# Patient Record
Sex: Female | Born: 1955 | Race: Black or African American | Hispanic: No | Smoking: Never smoker
Health system: Southern US, Community
[De-identification: ages and names within clinical notes are randomized; demographics above are authoritative.]

## PROBLEM LIST (undated history)

## (undated) DIAGNOSIS — I10 Essential (primary) hypertension: Secondary | ICD-10-CM

## (undated) DIAGNOSIS — E039 Hypothyroidism, unspecified: Secondary | ICD-10-CM

## (undated) HISTORY — PX: COLONOSCOPY: SHX174

## (undated) HISTORY — PX: ABDOMINAL HYSTERECTOMY: SHX81

## (undated) HISTORY — DX: Hypothyroidism, unspecified: E03.9

## (undated) HISTORY — DX: Essential (primary) hypertension: I10

---

## 2018-08-08 ENCOUNTER — Ambulatory Visit: Payer: Self-pay | Admitting: "Endocrinology

## 2018-11-24 ENCOUNTER — Encounter: Payer: Self-pay | Admitting: "Endocrinology

## 2018-11-24 ENCOUNTER — Other Ambulatory Visit: Payer: Self-pay

## 2018-11-24 ENCOUNTER — Ambulatory Visit (INDEPENDENT_AMBULATORY_CARE_PROVIDER_SITE_OTHER): Payer: BLUE CROSS/BLUE SHIELD | Admitting: "Endocrinology

## 2018-11-24 ENCOUNTER — Encounter (INDEPENDENT_AMBULATORY_CARE_PROVIDER_SITE_OTHER): Payer: Self-pay

## 2018-11-24 VITALS — BP 146/90 | HR 80 | Ht 62.0 in | Wt 197.0 lb

## 2018-11-24 DIAGNOSIS — E042 Nontoxic multinodular goiter: Secondary | ICD-10-CM | POA: Diagnosis not present

## 2018-11-24 NOTE — Progress Notes (Signed)
Endocrinology Consult Note                                            11/24/2018, 1:19 PM   Subjective:    Patient ID: Katherine Raymond, female    DOB: 01/06/1956, PCP Kathlee NationsEason, Paul, MD   Past Medical History:  Diagnosis Date  . Hypertension   . Hypothyroidism    Past Surgical History:  Procedure Laterality Date  . ABDOMINAL HYSTERECTOMY     Social History   Socioeconomic History  . Marital status: Divorced    Spouse name: Not on file  . Number of children: Not on file  . Years of education: Not on file  . Highest education level: Not on file  Occupational History  . Not on file  Social Needs  . Financial resource strain: Not on file  . Food insecurity    Worry: Not on file    Inability: Not on file  . Transportation needs    Medical: Not on file    Non-medical: Not on file  Tobacco Use  . Smoking status: Never Smoker  . Smokeless tobacco: Never Used  Substance and Sexual Activity  . Alcohol use: Not Currently  . Drug use: Never  . Sexual activity: Not on file  Lifestyle  . Physical activity    Days per week: Not on file    Minutes per session: Not on file  . Stress: Not on file  Relationships  . Social Musicianconnections    Talks on phone: Not on file    Gets together: Not on file    Attends religious service: Not on file    Active member of club or organization: Not on file    Attends meetings of clubs or organizations: Not on file    Relationship status: Not on file  Other Topics Concern  . Not on file  Social History Narrative  . Not on file   Family History  Problem Relation Age of Onset  . Diabetes Mother   . Thyroid disease Mother    Outpatient Encounter Medications as of 11/24/2018  Medication Sig  . ALPRAZolam (XANAX) 0.5 MG tablet every 8 (eight) hours as needed.  Katherine Raymond. amLODipine (NORVASC) 5 MG tablet daily.  Katherine Raymond. labetalol (NORMODYNE) 200 MG tablet 2 (two) times a day.  . levothyroxine (SYNTHROID) 25 MCG tablet Take 25 mcg by mouth daily  before breakfast.  . spironolactone (ALDACTONE) 25 MG tablet daily.   No facility-administered encounter medications on file as of 11/24/2018.    ALLERGIES: No Known Allergies  VACCINATION STATUS:  There is no immunization history on file for this patient.  HPI Katherine Raymond is 63 y.o. female who presents today with a medical history as above. she is being seen in consultation for multinodular goiter requested by Kathlee NationsEason, Paul, MD.  She is known to have multinodular goiter since at least 2016 at which time she underwent biopsy of thyroid nodules with benign findings.  More recently, she observed that her thyroid is growing in size and associated compressive symptoms including dysphagia, shortness of breath, and voice change.    -She is on a low-dose levothyroxine currently at 25 mcg p.o. daily a.m.  Her thyroid function tests from May 2020 are within normal limits.   She denies palpitations, tremors, nor heat/cold intolerance.    Denies family history of thyroid  malignancy, however a family history of various thyroid dysfunctions.   -She denies exposure to neck radiation.  Review of Systems  Constitutional: no recent weight gain/loss, no fatigue, no subjective hyperthermia, no subjective hypothermia Eyes: no blurry vision, no xerophthalmia ENT: no sore throat, no nodules palpated in throat, + dysphagia, +odynophagia, + hoarseness Cardiovascular: no Chest Pain, no Shortness of Breath, no palpitations, no leg swelling Respiratory: no cough, no shortness of breath Gastrointestinal: no Nausea/Vomiting/Diarhhea Musculoskeletal: no muscle/joint aches Skin: no rashes Neurological: no tremors, no numbness, no tingling, no dizziness Psychiatric: no depression, no anxiety  Objective:    BP (!) 146/90   Pulse 80   Ht 5\' 2"  (1.575 m)   Wt 197 lb (89.4 kg)   BMI 36.03 kg/m   Wt Readings from Last 3 Encounters:  11/24/18 197 lb (89.4 kg)    Physical Exam  Constitutional: + Obese for  height, not in acute distress, normal state of mind Eyes: PERRLA, EOMI, no exophthalmos ENT: moist mucous membranes, + gross thyromegaly, no gross cervical lymphadenopathy Cardiovascular: normal precordial activity, Regular Rate and Rhythm, no Murmur/Rubs/Gallops Respiratory:  adequate breathing efforts, no gross chest deformity, Clear to auscultation bilaterally Gastrointestinal: abdomen soft, Non -tender, No distension, Bowel Sounds present, no gross organomegaly Musculoskeletal: no gross deformities, strength intact in all four extremities Skin: moist, warm, no rashes Neurological: no tremor with outstretched hands, Deep tendon reflexes normal in bilateral lower extremities.  She does not have recent thyroid ultrasound.  Ultrasound-guided fine-needle aspiration from January 2016 was reviewed, showing benign findings.     Oct 28, 2018 thyroid function tests: TSH 1.5, free T4 1.2  Assessment & Plan:   1. Multinodular goiter - [name]  is being seen at a kind request of Katherine Hong, MD. - I have reviewed her available thyroid records and clinically evaluated the patient. - Based on these reviews, she has compressive nodular goiter,  however,  there is not sufficient information to proceed with definitive treatment plan. -This patient will benefit from direct simple thyroidectomy given her presentation with symptomatic compressive large goiter.  She is reluctant.  However she is willing to go for repeat thyroid ultrasound. She will be reevaluated with thyroid ultrasound in 2 weeks, when she will most likely need referral to surgery.  -She recalls that initiation of levothyroxine was for suppressive purposes which is proving to be effective.  Her thyroid function test from May 2020 were within normal limits. -She is advised to continue with levothyroxine for now at current dose of 25 mcg p.o. daily before breakfast.  - We discussed about the correct intake of her thyroid hormone, on  empty stomach at fasting, with water, separated by at least 30 minutes from breakfast and other medications,  and separated by more than 4 hours from calcium, iron, multivitamins, acid reflux medications (PPIs). -Patient is made aware of the fact that thyroid hormone replacement is needed for life, dose to be adjusted by periodic monitoring of thyroid function tests.  If she is in agreement with total thyroidectomy, she will require higher dose of levothyroxine subsequent to her surgery.   - I did not initiate any new prescriptions today. - I advised her  to maintain close follow up with Katherine Hong, MD for primary care needs.   - Time spent with the patient: 45 minutes, of which >50% was spent in obtaining information about her symptoms, reviewing her previous labs/studies,  evaluations, and treatments, counseling her about her large compressive multinodular goiter, and developing  a plan to confirm the diagnosis and long term treatment based on the latest standards of care/guidelines.    Chakira Daphine Raymond participated in the discussions, expressed understanding, and voiced agreement with the above plans.  All questions were answered to her satisfaction. she is encouraged to contact clinic should she have any questions or concerns prior to her return visit.  Follow up plan: Return in about 2 weeks (around 12/08/2018) for Thyroid / Neck Ultrasound.   Marquis LunchGebre Avneet Ashmore, MD [facility] [address] [place], [place] [address] Phone: [phone]  Fax: [phone]     11/24/2018, 1:19 PM  This note was partially dictated with voice recognition software. Similar sounding words can be transcribed inadequately or may not  be corrected upon review.

## 2018-12-12 ENCOUNTER — Ambulatory Visit (HOSPITAL_COMMUNITY)
Admission: RE | Admit: 2018-12-12 | Discharge: 2018-12-12 | Disposition: A | Discharge status: Home / Self Care | Payer: BLUE CROSS/BLUE SHIELD | Source: Ambulatory Visit | Attending: "Endocrinology | Admitting: "Endocrinology

## 2018-12-12 ENCOUNTER — Other Ambulatory Visit: Payer: Self-pay

## 2018-12-12 DIAGNOSIS — E042 Nontoxic multinodular goiter: Secondary | ICD-10-CM | POA: Diagnosis not present

## 2018-12-19 ENCOUNTER — Ambulatory Visit (INDEPENDENT_AMBULATORY_CARE_PROVIDER_SITE_OTHER): Payer: BLUE CROSS/BLUE SHIELD | Admitting: "Endocrinology

## 2018-12-19 ENCOUNTER — Other Ambulatory Visit: Payer: Self-pay

## 2018-12-19 ENCOUNTER — Encounter: Payer: Self-pay | Admitting: "Endocrinology

## 2018-12-19 DIAGNOSIS — E042 Nontoxic multinodular goiter: Secondary | ICD-10-CM | POA: Diagnosis not present

## 2018-12-19 NOTE — Progress Notes (Signed)
12/19/2018, 1:11 PM                                Endocrinology Telehealth Visit Follow up Note -During COVID -19 Pandemic  I connected with Katherine Raymond on 12/19/2018   by telephone and verified that I am speaking with the correct person using two identifiers. Katherine Raymond, 02-Jul-1955. she has verbally consented to this visit. All issues noted in this document were discussed and addressed. The format was not optimal for physical exam   Subjective:    Patient ID: Katherine Raymond, female    DOB: Dec 25, 1955, PCP Eber Hong, MD   Past Medical History:  Diagnosis Date  . Hypertension   . Hypothyroidism    Past Surgical History:  Procedure Laterality Date  . ABDOMINAL HYSTERECTOMY     Social History   Socioeconomic History  . Marital status: Divorced    Spouse name: Not on file  . Number of children: Not on file  . Years of education: Not on file  . Highest education level: Not on file  Occupational History  . Not on file  Social Needs  . Financial resource strain: Not on file  . Food insecurity    Worry: Not on file    Inability: Not on file  . Transportation needs    Medical: Not on file    Non-medical: Not on file  Tobacco Use  . Smoking status: Never Smoker  . Smokeless tobacco: Never Used  Substance and Sexual Activity  . Alcohol use: Not Currently  . Drug use: Never  . Sexual activity: Not on file  Lifestyle  . Physical activity    Days per week: Not on file    Minutes per session: Not on file  . Stress: Not on file  Relationships  . Social Herbalist on phone: Not on file    Gets together: Not on file    Attends religious service: Not on file    Active member of club or organization: Not on file    Attends meetings of clubs or organizations: Not on file    Relationship status: Not on file  Other Topics Concern  . Not on file  Social History Narrative  . Not on file   Family History   Problem Relation Age of Onset  . Diabetes Mother   . Thyroid disease Mother    Outpatient Encounter Medications as of 12/19/2018  Medication Sig  . ALPRAZolam (XANAX) 0.5 MG tablet every 8 (eight) hours as needed.  Marland Kitchen amLODipine (NORVASC) 5 MG tablet daily.  Marland Kitchen labetalol (NORMODYNE) 200 MG tablet 2 (two) times a day.  . levothyroxine (SYNTHROID) 25 MCG tablet Take 25 mcg by mouth daily before breakfast.  . spironolactone (ALDACTONE) 25 MG tablet daily.   No facility-administered encounter medications on file as of 12/19/2018.    ALLERGIES: No Known Allergies  VACCINATION STATUS:  There is no immunization history on file for this patient.  HPI Katherine Raymond is 63 y.o. female who presents today with a medical history as above. she is being seen in follow-up with repeat thyroid ultrasound after she was seen in consultation  for multinodular goiter last visit  Requested by Kathlee NationsEason, Paul, MD.  She is known to have multinodular goiter since at least 2016 at which time she underwent biopsy of thyroid nodules with benign findings.  More recently, she observed that her thyroid is growing in size and associated compressive symptoms including dysphagia, shortness of breath, and voice change.   -Her repeat ultrasound shows varus thyroid nodules as he has 3 and half centimeter requiring fine-needle aspiration.  She continues to have compressive symptoms including voice change.  -She is on a low-dose levothyroxine currently at 25 mcg p.o. daily a.m.  Her thyroid function tests from May 2020 are within normal limits.   She denies palpitations, tremors, nor heat/cold intolerance.    Denies family history of thyroid malignancy, however a family history of various thyroid dysfunctions.   -She denies exposure to neck radiation.  Review of Systems   ENT: no sore throat, no nodules palpated in throat, + dysphagia, +odynophagia, + hoarseness   Objective:    There were no vitals taken for this visit.   Wt Readings from Last 3 Encounters:  11/24/18 197 lb (89.4 kg)      She does not have recent thyroid ultrasound.  Ultrasound-guided fine-needle aspiration from January 2016 was reviewed, showing benign findings.     Oct 28, 2018 thyroid function tests: TSH 1.5, free T4 1.2    July 13th 2020 thyroid ultrasound IMPRESSION: 1. Findings suggestive of multinodular goiter. 2. Multiple bilateral nodules/pseudo nodules meet imaging criteria to recommend percutaneous sampling, however current imaging guidelines are to only biopsy the two most worrisome thyroid nodules at a given time. As such, nodules #2 and #4 could undergo ultrasound-guided fine-needle aspiration as clinically indicated.  Assessment & Plan:   1. Multinodular goiter 2.  Compressive symptoms. -Her repeat thyroid ultrasound confirms large multinodular goiter with some of her nodules displaying suspicious features.  She will need multiple fine-needle aspiration studies. -However, this patient will benefit from direct simple thyroidectomy given her presentation with symptomatic compressive large goiter.  -She is aware of the fact that she will need thyroid hormone replacement on a higher dose for rest of her life subsequent to her surgery.   -This will afford her relief of compressive neck symptoms, as well as elimination of pain risk of occult thyroid malignancy.    She understands and she agrees with plan.  I will arrange for referral [facility] [place] [place] [facility] [place].    -In the meantime, she is advised to continue her low-dose levothyroxine she will be reevaluated with thyroid ultrasound in 2 weeks, when she will most likely need referral to surgery.  -She recalls that initiation of levothyroxine was for suppressive purposes which is proving to be effective.  Her thyroid function test from May 2020 were within normal limits. -She is advised to continue with levothyroxine for now at current dose of 25 mcg  p.o. daily before breakfast.  This was started even before her last visit.  She will definitely require higher dose after her surgery.   - I did not initiate any new prescriptions today. - I advised her  to maintain close follow up with Kathlee NationsEason, Paul, MD for primary care needs.   Time for this visit: 15 minutes. Sera Daphine DeutscherMartin  participated in the discussions, expressed understanding, and voiced agreement with the above plans.  All questions were answered to her satisfaction. she is encouraged to contact clinic should she have any questions or concerns prior to her return  visit.   Follow up plan: Return in about 3 months (around 03/21/2019) for Follow up with Labs after Surgery.   Marquis LunchGebre Zenora Karpel, MD [facility] [address] [place], [place] [address] Phone: [phone]  Fax: [phone]     12/19/2018, 1:11 PM  This note was partially dictated with voice recognition software. Similar sounding words can be transcribed inadequately or may not  be corrected upon review.

## 2019-01-03 ENCOUNTER — Ambulatory Visit: Payer: BLUE CROSS/BLUE SHIELD | Admitting: General Surgery

## 2019-01-24 ENCOUNTER — Encounter: Payer: Self-pay | Admitting: General Surgery

## 2019-01-24 ENCOUNTER — Ambulatory Visit: Payer: BLUE CROSS/BLUE SHIELD | Admitting: General Surgery

## 2019-01-24 ENCOUNTER — Other Ambulatory Visit: Payer: Self-pay

## 2019-01-24 VITALS — BP 164/96 | HR 93 | Temp 97.5°F | Resp 16 | Ht 64.0 in | Wt 199.0 lb

## 2019-01-24 DIAGNOSIS — E042 Nontoxic multinodular goiter: Secondary | ICD-10-CM

## 2019-01-24 NOTE — Progress Notes (Signed)
Katherine Raymond; [id number]; 03/26/1956   HPI Patient is a 63 year old black female who was referred to my care by Dr. Fransico HimNida for evaluation treatment of a multinodular goiter.  Patient has had the goiter for some time now, but seems to have recently increased in size and is causing her multiple symptoms.  She has had more hoarseness to her voice and dysphasia.  She denies a family history of thyroid cancer, though multiple family members have had thyroid issues including surgery.  She denies any radiation to the neck.  She currently has no pain in her neck.  She does have a sensation that something is sitting on her throat when she is lying down. Past Medical History:  Diagnosis Date  . Hypertension   . Hypothyroidism     Past Surgical History:  Procedure Laterality Date  . ABDOMINAL HYSTERECTOMY      Family History  Problem Relation Age of Onset  . Diabetes Mother   . Thyroid disease Mother     Current Outpatient Medications on File Prior to Visit  Medication Sig Dispense Refill  . ALPRAZolam (XANAX) 0.5 MG tablet every 8 (eight) hours as needed.    Marland Kitchen. amLODipine (NORVASC) 5 MG tablet daily.    . cetirizine (ZYRTEC) 10 MG chewable tablet Chew 10 mg by mouth daily.    Marland Kitchen. labetalol (NORMODYNE) 200 MG tablet 2 (two) times a day.    . levothyroxine (SYNTHROID) 25 MCG tablet Take 25 mcg by mouth daily before breakfast.    . Multiple Vitamin (MULTI-DAY PO) Take 1 tablet by mouth 1 day or 1 dose.    . spironolactone (ALDACTONE) 25 MG tablet daily.     No current facility-administered medications on file prior to visit.     Allergies  Allergen Reactions  . Pantoprazole     Rash under breast. Taste bud problems.     Social History   Substance and Sexual Activity  Alcohol Use Not Currently    Social History   Tobacco Use  Smoking Status Never Smoker  Smokeless Tobacco Never Used    Review of Systems  Constitutional: Negative.   HENT: Positive for sinus pain.   Respiratory:  Positive for cough.   Gastrointestinal: Positive for heartburn.  Genitourinary: Negative.   Musculoskeletal: Positive for joint pain.  Skin: Negative.   Neurological: Negative.   Endo/Heme/Allergies: Negative.   Psychiatric/Behavioral: Negative.     Objective   Vitals:   01/24/19 1051  BP: (!) 164/96  Pulse: 93  Resp: 16  Temp: (!) 97.5 F (36.4 C)  SpO2: 98%    Physical Exam Vitals signs reviewed.  Constitutional:      Appearance: Normal appearance. She is not ill-appearing.  HENT:     Head: Normocephalic and atraumatic.  Neck:     Musculoskeletal: Neck supple.     Comments: Bilateral enlargement of the thyroid lobes, trachea midline with no significant extension into the chest.  Nodularity of both lobes noted. Cardiovascular:     Rate and Rhythm: Normal rate and regular rhythm.     Heart sounds: Normal heart sounds. No murmur. No friction rub. No gallop.   Pulmonary:     Effort: Pulmonary effort is normal. No respiratory distress.     Breath sounds: Normal breath sounds. No stridor. No wheezing, rhonchi or rales.  Lymphadenopathy:     Cervical: No cervical adenopathy.  Skin:    General: Skin is warm and dry.  Neurological:     Mental Status: She is alert and  oriented to person, place, and time.    Ultrasound report reviewed, Dr. Liliane Channel note reviewed. Assessment  Multinodular goiter Plan   Patient is scheduled for a total thyroidectomy on 02/10/2019.  The risks and benefits of the procedure including bleeding, infection, nerve injury, voice changes, low calcium, the possibility of malignancy were fully explained to the patient, who gave informed consent.  She is fully aware that she will need to be on lifelong thyroid supplementation.  Literature was given.

## 2019-01-24 NOTE — H&P (Signed)
Katherine Raymond; [id number]; 08/25/1955   HPI Patient is a 62-year-old black female who was referred to my care by Dr. Nida for evaluation treatment of a multinodular goiter.  Patient has had the goiter for some time now, but seems to have recently increased in size and is causing her multiple symptoms.  She has had more hoarseness to her voice and dysphasia.  She denies a family history of thyroid cancer, though multiple family members have had thyroid issues including surgery.  She denies any radiation to the neck.  She currently has no pain in her neck.  She does have a sensation that something is sitting on her throat when she is lying down. Past Medical History:  Diagnosis Date  . Hypertension   . Hypothyroidism     Past Surgical History:  Procedure Laterality Date  . ABDOMINAL HYSTERECTOMY      Family History  Problem Relation Age of Onset  . Diabetes Mother   . Thyroid disease Mother     Current Outpatient Medications on File Prior to Visit  Medication Sig Dispense Refill  . ALPRAZolam (XANAX) 0.5 MG tablet every 8 (eight) hours as needed.    . amLODipine (NORVASC) 5 MG tablet daily.    . cetirizine (ZYRTEC) 10 MG chewable tablet Chew 10 mg by mouth daily.    . labetalol (NORMODYNE) 200 MG tablet 2 (two) times a day.    . levothyroxine (SYNTHROID) 25 MCG tablet Take 25 mcg by mouth daily before breakfast.    . Multiple Vitamin (MULTI-DAY PO) Take 1 tablet by mouth 1 day or 1 dose.    . spironolactone (ALDACTONE) 25 MG tablet daily.     No current facility-administered medications on file prior to visit.     Allergies  Allergen Reactions  . Pantoprazole     Rash under breast. Taste bud problems.     Social History   Substance and Sexual Activity  Alcohol Use Not Currently    Social History   Tobacco Use  Smoking Status Never Smoker  Smokeless Tobacco Never Used    Review of Systems  Constitutional: Negative.   HENT: Positive for sinus pain.   Respiratory:  Positive for cough.   Gastrointestinal: Positive for heartburn.  Genitourinary: Negative.   Musculoskeletal: Positive for joint pain.  Skin: Negative.   Neurological: Negative.   Endo/Heme/Allergies: Negative.   Psychiatric/Behavioral: Negative.     Objective   Vitals:   01/24/19 1051  BP: (!) 164/96  Pulse: 93  Resp: 16  Temp: (!) 97.5 F (36.4 C)  SpO2: 98%    Physical Exam Vitals signs reviewed.  Constitutional:      Appearance: Normal appearance. She is not ill-appearing.  HENT:     Head: Normocephalic and atraumatic.  Neck:     Musculoskeletal: Neck supple.     Comments: Bilateral enlargement of the thyroid lobes, trachea midline with no significant extension into the chest.  Nodularity of both lobes noted. Cardiovascular:     Rate and Rhythm: Normal rate and regular rhythm.     Heart sounds: Normal heart sounds. No murmur. No friction rub. No gallop.   Pulmonary:     Effort: Pulmonary effort is normal. No respiratory distress.     Breath sounds: Normal breath sounds. No stridor. No wheezing, rhonchi or rales.  Lymphadenopathy:     Cervical: No cervical adenopathy.  Skin:    General: Skin is warm and dry.  Neurological:     Mental Status: She is alert and   oriented to person, place, and time.    Ultrasound report reviewed, Dr. Nida's note reviewed. Assessment  Multinodular goiter Plan   Patient is scheduled for a total thyroidectomy on 02/10/2019.  The risks and benefits of the procedure including bleeding, infection, nerve injury, voice changes, low calcium, the possibility of malignancy were fully explained to the patient, who gave informed consent.  She is fully aware that she will need to be on lifelong thyroid supplementation.  Literature was given. 

## 2019-01-24 NOTE — Patient Instructions (Signed)
Thyroidectomy A thyroidectomy is a surgery that is done to remove the thyroid gland. The thyroid is a butterfly-shaped gland that is located at the lower front of your neck. It produces thyroid hormone, which is a substance that helps to control certain body processes. You may have a:  Total thyroidectomy. All of your thyroid is removed.  Thyroid lobectomy. Part of your thyroid is removed. The amount of thyroid gland tissue that is removed during your surgery depends on the reason for the procedure. Reasons to have this procedure include treatment for:  Thyroid nodules.  Thyroid cancer.  Benign thyroid tumors.  Goiter.  Overactive thyroid gland (hyperthyroidism). There are two ways to do this procedure. Conventional, or open, thyroidectomy uses one large incision to remove the thyroid gland. This is the most common method. Endoscopic thyroidectomy, a less invasive method, uses a narrow tube with a light and camera (endoscope) to remove the gland. Tell a health care provider about:  Any allergies you have.  All medicines you are taking, including vitamins, herbs, eye drops, creams, and over-the-counter medicines.  Any problems you or family members have had with anesthetic medicines.  Any blood disorders you have.  Any surgeries you have had.  Any medical conditions you have.  Whether you are pregnant or may be pregnant. What are the risks? Generally, this is a safe procedure. However, problems may occur, including:  Damage to the parathyroid glands. These are located behind your thyroid gland. They maintain the calcium levels in the body. Damage may lead to: ? A decrease in parathyroid hormone levels (hypoparathyroidism). ? A decrease in calcium levels. This will make your nerves irritable and may cause muscle spasms.  An increase in thyroid hormone.  Damage to the nerves of your voice box (larynx). This can be temporary or long-term (rare).  Hoarseness. This usually  resolves in 24-48 hours.  Bleeding.  Infection. What happens before the procedure? Medicines Ask your health care provider about:  Changing or stopping your regular medicines. This is especially important if you are taking diabetes medicines or blood thinners.  Taking medicines such as aspirin and ibuprofen. These medicines can thin your blood. Do not take these medicines unless your health care provider tells you to take them.  Taking over-the-counter medicines, vitamins, herbs, and supplements. General instructions  You may be asked to shower with a germ-killing soap.  Plan to have someone take you home from the hospital or clinic.  Plan to have a responsible adult care for you for at least 24 hours after you leave the hospital or clinic. This is important. What happens during the procedure?  To reduce your risk of infection: ? Your health care team will wash or sanitize their hands. ? Hair may be removed from the surgical area. ? Your skin will be washed with soap.  An IV will be inserted into one of your veins.  You will be given one or more of the following: ? A medicine to help you relax (sedative). ? A medicine to make you fall asleep (general anesthetic).  Your health care provider will perform your surgery using one of two methods: ? For open thyroidectomy, an incision will be made in your lower neck. Muscles in the area will be separated to reveal your thyroid gland. ? For endoscopic thyroidectomy, several small incisions will be made in your neck, chest, or armpit. An endoscopewill be inserted into an incision.  Your health care provider may monitor laryngeal nerve function during the procedure for  safety reasons.  Part or all of your thyroid gland will be removed.  A tube (drain) may be placed at the incision site to drain blood and fluids that accumulate under the skin after the procedure. The drain may have to stay in place for a day or two after the procedure.   The incision will be closed with stitches (sutures).  A dressing will be placed over your incision. The procedure may vary among health care providers and hospitals. What happens after the procedure?  Your blood pressure, heart rate, breathing rate, and blood oxygen level will be monitored often until the medicines you were given have worn off.  You will be given pain medicine as needed.  Your provider will check your ability to talk and swallow after the procedure.  You will gradually start to drink liquids and have soft foods as tolerated.  You may have a blood test to check the level of calcium in your body.  If you had a drain put in during the procedure, it will usually be removed the next day. Summary  A thyroidectomy is a surgery that is done to remove the thyroid gland.  The procedure will be done in one of two ways: conventional, or open, thyroidectomy or endoscopic thyroidectomy.  Serious complications are rare.  Plan to have a responsible adult care for you for at least 24 hours after you leave the hospital or clinic. This is important. This information is not intended to replace advice given to you by your health care provider. Make sure you discuss any questions you have with your health care provider. Document Released: 11/11/2000 Document Revised: 04/30/2017 Document Reviewed: 03/23/2017 Elsevier Patient Education  2020 [employer].

## 2019-01-31 NOTE — Patient Instructions (Signed)
Your procedure is scheduled on: 02/10/2019  Report to [facility] at 6:15     AM.  Call this number if you have problems the morning of surgery: [phone]   Remember:   Do not Eat or Drink after midnight   :  Take these medicines the morning of surgery with A SIP OF WATER: Amlodipine, Zyrtec, labetalol, and levothroxine   Do not wear jewelry, make-up or nail polish.  Do not wear lotions, powders, or perfumes. You may wear deodorant.  Do not shave 48 hours prior to surgery. Men may shave face and neck.  Do not bring valuables to the hospital.  Contacts, dentures or bridgework may not be worn into surgery.  Leave suitcase in the car. After surgery it may be brought to your room.  For patients admitted to the hospital, checkout time is 11:00 AM the day of discharge.   Patients discharged the day of surgery will not be allowed to drive home.    Special Instructions: Shower using CHG night before surgery and shower the day of surgery use CHG.  Use special wash - you have one bottle of CHG for all showers.  You should use approximately 1/2 of the bottle for each shower.  Thyroidectomy A thyroidectomy is a surgery that is done to remove the thyroid gland. The thyroid is a butterfly-shaped gland that is located at the lower front of your neck. It produces thyroid hormone, which is a substance that helps to control certain body processes. You may have a:  Total thyroidectomy. All of your thyroid is removed.  Thyroid lobectomy. Part of your thyroid is removed. The amount of thyroid gland tissue that is removed during your surgery depends on the reason for the procedure. Reasons to have this procedure include treatment for:  Thyroid nodules.  Thyroid cancer.  Benign thyroid tumors.  Goiter.  Overactive thyroid gland (hyperthyroidism). There are two ways to do this procedure. Conventional, or open, thyroidectomy uses one large incision to remove the thyroid gland. This is the most  common method. Endoscopic thyroidectomy, a less invasive method, uses a narrow tube with a light and camera (endoscope) to remove the gland. Tell a health care provider about:  Any allergies you have.  All medicines you are taking, including vitamins, herbs, eye drops, creams, and over-the-counter medicines.  Any problems you or family members have had with anesthetic medicines.  Any blood disorders you have.  Any surgeries you have had.  Any medical conditions you have.  Whether you are pregnant or may be pregnant. What are the risks? Generally, this is a safe procedure. However, problems may occur, including:  Damage to the parathyroid glands. These are located behind your thyroid gland. They maintain the calcium levels in the body. Damage may lead to: ? A decrease in parathyroid hormone levels (hypoparathyroidism). ? A decrease in calcium levels. This will make your nerves irritable and may cause muscle spasms.  An increase in thyroid hormone.  Damage to the nerves of your voice box (larynx). This can be temporary or long-term (rare).  Hoarseness. This usually resolves in 24-48 hours.  Bleeding.  Infection. What happens before the procedure? Staying hydrated Follow instructions from your health care provider about hydration, which may include:  Up to 2 hours before the procedure - you may continue to drink clear liquids, such as water, clear fruit juice, black coffee, and plain tea. Eating and drinking restrictions Follow instructions from your health care provider about eating and drinking, which may  include:  8 hours before the procedure - stop eating heavy meals or foods such as meat, fried foods, or fatty foods.  6 hours before the procedure - stop eating light meals or foods, such as toast or cereal.  6 hours before the procedure - stop drinking milk or drinks that contain milk.  2 hours before the procedure - stop drinking clear liquids. Medicines Ask your  health care provider about:  Changing or stopping your regular medicines. This is especially important if you are taking diabetes medicines or blood thinners.  Taking medicines such as aspirin and ibuprofen. These medicines can thin your blood. Do not take these medicines unless your health care provider tells you to take them.  Taking over-the-counter medicines, vitamins, herbs, and supplements. General instructions  You may be asked to shower with a germ-killing soap.  Plan to have someone take you home from the hospital or clinic.  Plan to have a responsible adult care for you for at least 24 hours after you leave the hospital or clinic. This is important. What happens during the procedure?  To reduce your risk of infection: ? Your health care team will wash or sanitize their hands. ? Hair may be removed from the surgical area. ? Your skin will be washed with soap.  An IV will be inserted into one of your veins.  You will be given one or more of the following: ? A medicine to help you relax (sedative). ? A medicine to make you fall asleep (general anesthetic).  Your health care provider will perform your surgery using one of two methods: ? For open thyroidectomy, an incision will be made in your lower neck. Muscles in the area will be separated to reveal your thyroid gland. ? For endoscopic thyroidectomy, several small incisions will be made in your neck, chest, or armpit. An endoscopewill be inserted into an incision.  Your health care provider may monitor laryngeal nerve function during the procedure for safety reasons.  Part or all of your thyroid gland will be removed.  A tube (drain) may be placed at the incision site to drain blood and fluids that accumulate under the skin after the procedure. The drain may have to stay in place for a day or two after the procedure.  The incision will be closed with stitches (sutures).  A dressing will be placed over your incision.  The procedure may vary among health care providers and hospitals. What happens after the procedure?  Your blood pressure, heart rate, breathing rate, and blood oxygen level will be monitored often until the medicines you were given have worn off.  You will be given pain medicine as needed.  Your provider will check your ability to talk and swallow after the procedure.  You will gradually start to drink liquids and have soft foods as tolerated.  You may have a blood test to check the level of calcium in your body.  If you had a drain put in during the procedure, it will usually be removed the next day. Summary  A thyroidectomy is a surgery that is done to remove the thyroid gland.  The procedure will be done in one of two ways: conventional, or open, thyroidectomy or endoscopic thyroidectomy.  Serious complications are rare.  Plan to have a responsible adult care for you for at least 24 hours after you leave the hospital or clinic. This is important. This information is not intended to replace advice given to you by your health care  provider. Make sure you discuss any questions you have with your health care provider. Document Released: 11/11/2000 Document Revised: 04/30/2017 Document Reviewed: 03/23/2017 Elsevier Patient Education  2020 [employer].  Thyroidectomy, Care After This sheet gives you information about how to care for yourself after your procedure. Your health care provider may also give you more specific instructions. If you have problems or questions, contact your health care provider. What can I expect after the procedure? After the procedure, it is common to have:  Mild pain in the neck or upper body, especially when swallowing.  A swollen neck.  A sore throat.  A weak or hoarse voice.  Slight tingling or numbness around your mouth, or in your fingers or toes. This may last for a day or two after surgery. This condition is caused by low levels of calcium.  You may be given calcium supplements to treat it. Follow these instructions at home:  Medicines  Take over-the-counter and prescription medicines only as told by your health care provider.  Do not drive or use heavy machinery while taking prescription pain medicine.  Do not take medicines that contain aspirin and ibuprofen until your health care provider says that you can. These medicines can increase your risk of bleeding.  Take a thyroid hormone medicine as recommended by your health care provider. You will have to take this medicine for the rest of your life if your entire thyroid was removed. Eating and drinking  Start slowly with eating. You may need to have only liquids and soft foods for a few days or as directed by your health care provider.  To prevent or treat constipation while you are taking prescription pain medicine, your health care provider may recommend that you: ? Drink enough fluid to keep your urine pale yellow. ? Take over-the-counter or prescription medicines. ? Eat foods that are high in fiber, such as fresh fruits and vegetables, whole grains, and beans. ? Limit foods that are high in fat and processed sugars, such as fried and sweet foods. Incision care  Follow instructions from your health care provider about how to take care of your incision. Make sure you: ? Wash your hands with soap and water before you change your bandage (dressing). If soap and water are not available, use hand sanitizer. ? Change your dressing as told by your health care provider. ? Leave stitches (sutures), skin glue, or adhesive strips in place. These skin closures may need to stay in place for 2 weeks or longer. If adhesive strip edges start to loosen and curl up, you may trim the loose edges. Do not remove adhesive strips completely unless your health care provider tells you to do that.  Check your incision area every day for signs of infection. Check for: ? Redness, swelling, or pain.  ? Fluid or blood. ? Warmth. ? Pus or a bad smell.  Do not take baths, swim, or use a hot tub until your health care provider approves. Activity  For the first 10 days after the procedure or as instructed by your health care provider: ? Do not lift anything that is heavier than 10 lb (4.5 kg). ? Do not jog, swim, or do other strenuous exercises. ? Do not play contact sports.  Avoid sitting for a long time without moving. Get up to take short walks every 1-2 hours. This is needed to improve blood flow and breathing. Ask for help if you feel weak or unsteady.  Return to your normal activities as  told by your health care provider. Ask your health care provider what activities are safe for you. General instructions  Do not use any products that contain nicotine or tobacco, such as cigarettes and e-cigarettes. These can delay healing after surgery. If you need help quitting, ask your health care provider.  Keep all follow-up visits as told by your health care provider. This is important. Your health care provider needs to monitor the calcium level in your blood to make sure that it does not become low. Contact a health care provider if you:  Have a fever.  Have more redness, swelling, or pain around your incision area.  Have fluid or blood coming from your incision area.  Notice that your incision area feels warm to the touch.  Have pus or a bad smell coming from your incision area.  Have trouble talking.  Have nausea or vomiting for more than 2 days. Get help right away if you:  Have trouble breathing.  Have trouble swallowing.  Develop a rash.  Develop a cough that gets worse.  Notice that your speech changes, or you have hoarseness that gets worse.  Develop numbness, tingling, or muscle spasms in the arms, hands, feet, or face. Summary  After the procedure, it is common to feel mild pain in the neck or upper body, especially when swallowing.  Take medicines as told  by your health care provider. These include pain medicines and thyroid hormones, if required.  Follow instructions from your health care provider about how to take care of your incision. Watch for signs of infection.  Keep all follow-up visits as told by your health care provider. This is important. Your health care provider needs to monitor the calcium level in your blood to make sure that it does not become low.  Get help right away if you develop difficulty breathing, or numbness, tingling, or muscle spasms in the arms, hands, feet, or face. This information is not intended to replace advice given to you by your health care provider. Make sure you discuss any questions you have with your health care provider. Document Released: 12/05/2004 Document Revised: 07/14/2018 Document Reviewed: 03/23/2017 Elsevier Patient Education  2020 [employer]  General Anesthesia, Adult, Care After This sheet gives you information about how to care for yourself after your procedure. Your health care provider may also give you more specific instructions. If you have problems or questions, contact your health care provider. What can I expect after the procedure? After the procedure, the following side effects are common:  Pain or discomfort at the IV site.  Nausea.  Vomiting.  Sore throat.  Trouble concentrating.  Feeling cold or chills.  Weak or tired.  Sleepiness and fatigue.  Soreness and body aches. These side effects can affect parts of the body that were not involved in surgery. Follow these instructions at home:  For at least 24 hours after the procedure:  Have a responsible adult stay with you. It is important to have someone help care for you until you are awake and alert.  Rest as needed.  Do not: ? Participate in activities in which you could fall or become injured. ? Drive. ? Use heavy machinery. ? Drink alcohol. ? Take sleeping pills or medicines that cause drowsiness. ?  Make important decisions or sign legal documents. ? Take care of children on your own. Eating and drinking  Follow any instructions from your health care provider about eating or drinking restrictions.  When you feel hungry, start  by eating small amounts of foods that are soft and easy to digest (bland), such as toast. Gradually return to your regular diet.  Drink enough fluid to keep your urine pale yellow.  If you vomit, rehydrate by drinking water, juice, or clear broth. General instructions  If you have sleep apnea, surgery and certain medicines can increase your risk for breathing problems. Follow instructions from your health care provider about wearing your sleep device: ? Anytime you are sleeping, including during daytime naps. ? While taking prescription pain medicines, sleeping medicines, or medicines that make you drowsy.  Return to your normal activities as told by your health care provider. Ask your health care provider what activities are safe for you.  Take over-the-counter and prescription medicines only as told by your health care provider.  If you smoke, do not smoke without supervision.  Keep all follow-up visits as told by your health care provider. This is important. Contact a health care provider if:  You have nausea or vomiting that does not get better with medicine.  You cannot eat or drink without vomiting.  You have pain that does not get better with medicine.  You are unable to pass urine.  You develop a skin rash.  You have a fever.  You have redness around your IV site that gets worse. Get help right away if:  You have difficulty breathing.  You have chest pain.  You have blood in your urine or stool, or you vomit blood. Summary  After the procedure, it is common to have a sore throat or nausea. It is also common to feel tired.  Have a responsible adult stay with you for the first 24 hours after general anesthesia. It is important to have  someone help care for you until you are awake and alert.  When you feel hungry, start by eating small amounts of foods that are soft and easy to digest (bland), such as toast. Gradually return to your regular diet.  Drink enough fluid to keep your urine pale yellow.  Return to your normal activities as told by your health care provider. Ask your health care provider what activities are safe for you. This information is not intended to replace advice given to you by your health care provider. Make sure you discuss any questions you have with your health care provider. Document Released: 08/24/2000 Document Revised: 05/21/2017 Document Reviewed: 01/01/2017 Elsevier Patient Education  2020 [employer].

## 2019-02-07 ENCOUNTER — Ambulatory Visit (HOSPITAL_COMMUNITY)
Admission: RE | Admit: 2019-02-07 | Discharge: 2019-02-07 | Disposition: A | Discharge status: Home / Self Care | Payer: BLUE CROSS/BLUE SHIELD | Source: Ambulatory Visit | Attending: General Surgery | Admitting: General Surgery

## 2019-02-07 ENCOUNTER — Encounter (HOSPITAL_COMMUNITY)
Admission: RE | Admit: 2019-02-07 | Discharge: 2019-02-07 | Disposition: A | Discharge status: Home / Self Care | Payer: BLUE CROSS/BLUE SHIELD | Source: Ambulatory Visit | Attending: General Surgery | Admitting: General Surgery

## 2019-02-07 ENCOUNTER — Encounter (HOSPITAL_COMMUNITY): Payer: Self-pay

## 2019-02-07 ENCOUNTER — Other Ambulatory Visit: Payer: Self-pay

## 2019-02-07 ENCOUNTER — Other Ambulatory Visit (HOSPITAL_COMMUNITY)
Admission: RE | Admit: 2019-02-07 | Discharge: 2019-02-07 | Disposition: A | Discharge status: Home / Self Care | Payer: BLUE CROSS/BLUE SHIELD | Source: Ambulatory Visit | Attending: General Surgery | Admitting: General Surgery

## 2019-02-07 DIAGNOSIS — E042 Nontoxic multinodular goiter: Secondary | ICD-10-CM

## 2019-02-07 LAB — CBC WITH DIFFERENTIAL/PLATELET
Abs Immature Granulocytes: 0.04 10*3/uL (ref 0.00–0.07)
Basophils Absolute: 0.1 10*3/uL (ref 0.0–0.1)
Basophils Relative: 1 %
Eosinophils Absolute: 0.4 10*3/uL (ref 0.0–0.5)
Eosinophils Relative: 7 %
HCT: 37.1 % (ref 36.0–46.0)
Hemoglobin: 12.7 g/dL (ref 12.0–15.0)
Immature Granulocytes: 1 %
Lymphocytes Relative: 24 %
Lymphs Abs: 1.3 10*3/uL (ref 0.7–4.0)
MCH: 29.4 pg (ref 26.0–34.0)
MCHC: 34.2 g/dL (ref 30.0–36.0)
MCV: 85.9 fL (ref 80.0–100.0)
Monocytes Absolute: 0.3 10*3/uL (ref 0.1–1.0)
Monocytes Relative: 6 %
Neutro Abs: 3.2 10*3/uL (ref 1.7–7.7)
Neutrophils Relative %: 61 %
Platelets: 304 10*3/uL (ref 150–400)
RBC: 4.32 MIL/uL (ref 3.87–5.11)
RDW: 12 % (ref 11.5–15.5)
WBC: 5.2 10*3/uL (ref 4.0–10.5)
nRBC: 0 % (ref 0.0–0.2)

## 2019-02-07 LAB — COMPREHENSIVE METABOLIC PANEL
ALT: 29 U/L (ref 0–44)
AST: 33 U/L (ref 15–41)
Albumin: 4.3 g/dL (ref 3.5–5.0)
Alkaline Phosphatase: 72 U/L (ref 38–126)
Anion gap: 8 (ref 5–15)
BUN: 7 mg/dL — ABNORMAL LOW (ref 8–23)
CO2: 24 mmol/L (ref 22–32)
Calcium: 9.5 mg/dL (ref 8.9–10.3)
Chloride: 109 mmol/L (ref 98–111)
Creatinine, Ser: 0.78 mg/dL (ref 0.44–1.00)
GFR calc Af Amer: >60 mL/min (ref >60)
GFR calc non Af Amer: >60 mL/min (ref >60)
Glucose, Bld: 121 mg/dL — ABNORMAL HIGH (ref 70–99)
Potassium: 3.5 mmol/L (ref 3.5–5.1)
Sodium: 141 mmol/L (ref 135–145)
Total Bilirubin: 2.1 mg/dL — ABNORMAL HIGH (ref 0.3–1.2)
Total Protein: 7.8 g/dL (ref 6.5–8.1)

## 2019-02-08 ENCOUNTER — Encounter (HOSPITAL_COMMUNITY)
Admission: RE | Admit: 2019-02-08 | Discharge: 2019-02-08 | Disposition: A | Discharge status: Home / Self Care | Payer: BLUE CROSS/BLUE SHIELD | Source: Ambulatory Visit | Attending: General Surgery | Admitting: General Surgery

## 2019-02-08 DIAGNOSIS — Z01812 Encounter for preprocedural laboratory examination: Secondary | ICD-10-CM | POA: Insufficient documentation

## 2019-02-08 LAB — TSH: TSH: 1.104 u[IU]/mL (ref 0.350–4.500)

## 2019-02-08 LAB — SARS CORONAVIRUS 2 (TAT 6-24 HRS): SARS Coronavirus 2: NEGATIVE

## 2019-02-10 ENCOUNTER — Encounter (HOSPITAL_COMMUNITY)
Admission: RE | Disposition: A | Discharge status: Home / Self Care | Payer: Self-pay | Source: Home / Self Care | Attending: General Surgery

## 2019-02-10 ENCOUNTER — Observation Stay (HOSPITAL_COMMUNITY)
Admission: RE | Admit: 2019-02-10 | Discharge: 2019-02-11 | Disposition: A | Discharge status: Home / Self Care | Payer: BLUE CROSS/BLUE SHIELD | Attending: General Surgery | Admitting: General Surgery

## 2019-02-10 ENCOUNTER — Encounter (HOSPITAL_COMMUNITY): Payer: Self-pay

## 2019-02-10 ENCOUNTER — Ambulatory Visit (HOSPITAL_COMMUNITY): Payer: BLUE CROSS/BLUE SHIELD | Admitting: Anesthesiology

## 2019-02-10 ENCOUNTER — Other Ambulatory Visit: Payer: Self-pay

## 2019-02-10 DIAGNOSIS — E042 Nontoxic multinodular goiter: Principal | ICD-10-CM

## 2019-02-10 DIAGNOSIS — I10 Essential (primary) hypertension: Secondary | ICD-10-CM | POA: Insufficient documentation

## 2019-02-10 DIAGNOSIS — Z79899 Other long term (current) drug therapy: Secondary | ICD-10-CM | POA: Insufficient documentation

## 2019-02-10 DIAGNOSIS — Z79890 Hormone replacement therapy (postmenopausal): Secondary | ICD-10-CM | POA: Diagnosis not present

## 2019-02-10 DIAGNOSIS — Z8349 Family history of other endocrine, nutritional and metabolic diseases: Secondary | ICD-10-CM | POA: Diagnosis not present

## 2019-02-10 DIAGNOSIS — E039 Hypothyroidism, unspecified: Secondary | ICD-10-CM | POA: Insufficient documentation

## 2019-02-10 DIAGNOSIS — R740 Nonspecific elevation of levels of transaminase and lactic acid dehydrogenase [LDH]: Secondary | ICD-10-CM | POA: Insufficient documentation

## 2019-02-10 DIAGNOSIS — K760 Fatty (change of) liver, not elsewhere classified: Secondary | ICD-10-CM | POA: Diagnosis not present

## 2019-02-10 DIAGNOSIS — E890 Postprocedural hypothyroidism: Secondary | ICD-10-CM

## 2019-02-10 DIAGNOSIS — Z9089 Acquired absence of other organs: Secondary | ICD-10-CM

## 2019-02-10 DIAGNOSIS — R7401 Elevation of levels of liver transaminase levels: Secondary | ICD-10-CM

## 2019-02-10 HISTORY — PX: THYROIDECTOMY: SHX17

## 2019-02-10 LAB — COMPREHENSIVE METABOLIC PANEL
ALT: 267 U/L — ABNORMAL HIGH (ref 0–44)
AST: 234 U/L — ABNORMAL HIGH (ref 15–41)
Albumin: 3.9 g/dL (ref 3.5–5.0)
Alkaline Phosphatase: 83 U/L (ref 38–126)
Anion gap: 11 (ref 5–15)
BUN: 9 mg/dL (ref 8–23)
CO2: 23 mmol/L (ref 22–32)
Calcium: 8.8 mg/dL — ABNORMAL LOW (ref 8.9–10.3)
Chloride: 104 mmol/L (ref 98–111)
Creatinine, Ser: 0.77 mg/dL (ref 0.44–1.00)
GFR calc Af Amer: >60 mL/min (ref >60)
GFR calc non Af Amer: >60 mL/min (ref >60)
Glucose, Bld: 159 mg/dL — ABNORMAL HIGH (ref 70–99)
Potassium: 3.8 mmol/L (ref 3.5–5.1)
Sodium: 138 mmol/L (ref 135–145)
Total Bilirubin: 1.8 mg/dL — ABNORMAL HIGH (ref 0.3–1.2)
Total Protein: 7.6 g/dL (ref 6.5–8.1)

## 2019-02-10 SURGERY — THYROIDECTOMY
Anesthesia: General

## 2019-02-10 MED ORDER — MIDAZOLAM HCL 2 MG/2ML IJ SOLN: 0.5000 mg | Freq: Once | INTRAMUSCULAR | Status: DC | PRN

## 2019-02-10 MED ORDER — PROPOFOL 10 MG/ML IV BOLUS
INTRAVENOUS | Status: DC | PRN
  Administered 2019-02-10: 200 mg via INTRAVENOUS

## 2019-02-10 MED ORDER — PROMETHAZINE HCL 25 MG/ML IJ SOLN: 6.2500 mg | INTRAMUSCULAR | Status: DC | PRN

## 2019-02-10 MED ORDER — LACTATED RINGERS IV SOLN
INTRAVENOUS | Status: DC
  Administered 2019-02-10 (×3): via INTRAVENOUS

## 2019-02-10 MED ORDER — LEVOTHYROXINE SODIUM 100 MCG PO TABS
100.0000 ug | ORAL_TABLET | Freq: Every day | ORAL | Status: DC
  Administered 2019-02-11: 06:00:00 100 ug via ORAL
  Filled 2019-02-10: qty 1

## 2019-02-10 MED ORDER — SPIRONOLACTONE 25 MG PO TABS
25.0000 mg | ORAL_TABLET | Freq: Every day | ORAL | Status: DC
  Administered 2019-02-11: 08:00:00 25 mg via ORAL
  Filled 2019-02-10: qty 1

## 2019-02-10 MED ORDER — CHLORHEXIDINE GLUCONATE CLOTH 2 % EX PADS: 6.0000 | MEDICATED_PAD | Freq: Once | CUTANEOUS | Status: DC

## 2019-02-10 MED ORDER — DIPHENHYDRAMINE HCL 50 MG/ML IJ SOLN: 25.0000 mg | Freq: Four times a day (QID) | INTRAMUSCULAR | Status: DC | PRN

## 2019-02-10 MED ORDER — ACETAMINOPHEN 325 MG PO TABS: 650.0000 mg | ORAL_TABLET | Freq: Four times a day (QID) | ORAL | Status: DC | PRN

## 2019-02-10 MED ORDER — ROCURONIUM BROMIDE 10 MG/ML (PF) SYRINGE
PREFILLED_SYRINGE | INTRAVENOUS | Status: AC
  Filled 2019-02-10: qty 10

## 2019-02-10 MED ORDER — HYDROCODONE-ACETAMINOPHEN 5-325 MG PO TABS: 1.0000 | ORAL_TABLET | ORAL | Status: DC | PRN

## 2019-02-10 MED ORDER — LORATADINE 10 MG PO TABS
10.0000 mg | ORAL_TABLET | Freq: Every day | ORAL | Status: DC
  Administered 2019-02-10: 10 mg via ORAL
  Filled 2019-02-10 (×2): qty 1

## 2019-02-10 MED ORDER — CALCIUM CARBONATE-VITAMIN D 500-200 MG-UNIT PO TABS
2.0000 | ORAL_TABLET | Freq: Two times a day (BID) | ORAL | Status: DC
  Administered 2019-02-10 – 2019-02-11 (×2): 2 via ORAL
  Filled 2019-02-10 (×2): qty 2

## 2019-02-10 MED ORDER — FENTANYL CITRATE (PF) 100 MCG/2ML IJ SOLN
INTRAMUSCULAR | Status: DC | PRN
  Administered 2019-02-10 (×2): 25 ug via INTRAVENOUS
  Administered 2019-02-10 (×2): 50 ug via INTRAVENOUS

## 2019-02-10 MED ORDER — LABETALOL HCL 200 MG PO TABS
200.0000 mg | ORAL_TABLET | Freq: Two times a day (BID) | ORAL | Status: DC
  Administered 2019-02-10 – 2019-02-11 (×2): 200 mg via ORAL
  Filled 2019-02-10 (×2): qty 1

## 2019-02-10 MED ORDER — SUCCINYLCHOLINE CHLORIDE 200 MG/10ML IV SOSY
PREFILLED_SYRINGE | INTRAVENOUS | Status: AC
  Filled 2019-02-10: qty 10

## 2019-02-10 MED ORDER — DIPHENHYDRAMINE HCL 25 MG PO CAPS: 25.0000 mg | ORAL_CAPSULE | Freq: Four times a day (QID) | ORAL | Status: DC | PRN

## 2019-02-10 MED ORDER — BUPIVACAINE LIPOSOME 1.3 % IJ SUSP
INTRAMUSCULAR | Status: DC | PRN
  Administered 2019-02-10: 10 mL

## 2019-02-10 MED ORDER — FENTANYL CITRATE (PF) 250 MCG/5ML IJ SOLN
INTRAMUSCULAR | Status: AC
  Filled 2019-02-10: qty 5

## 2019-02-10 MED ORDER — ONDANSETRON HCL 4 MG/2ML IJ SOLN
4.0000 mg | Freq: Four times a day (QID) | INTRAMUSCULAR | Status: DC | PRN
  Administered 2019-02-10: 4 mg via INTRAVENOUS
  Filled 2019-02-10: qty 2

## 2019-02-10 MED ORDER — LIDOCAINE HCL (CARDIAC) PF 100 MG/5ML IV SOSY
PREFILLED_SYRINGE | INTRAVENOUS | Status: DC | PRN
  Administered 2019-02-10: 80 mg via INTRAVENOUS

## 2019-02-10 MED ORDER — KETOROLAC TROMETHAMINE 30 MG/ML IJ SOLN: 30.0000 mg | Freq: Four times a day (QID) | INTRAMUSCULAR | Status: DC | PRN

## 2019-02-10 MED ORDER — ACETAMINOPHEN 650 MG RE SUPP: 650.0000 mg | Freq: Four times a day (QID) | RECTAL | Status: DC | PRN

## 2019-02-10 MED ORDER — HYDROMORPHONE HCL 1 MG/ML IJ SOLN
1.0000 mg | INTRAMUSCULAR | Status: DC | PRN
  Administered 2019-02-10: 1 mg via INTRAVENOUS
  Filled 2019-02-10: qty 1

## 2019-02-10 MED ORDER — ENOXAPARIN SODIUM 40 MG/0.4ML ~~LOC~~ SOLN
40.0000 mg | SUBCUTANEOUS | Status: DC
  Administered 2019-02-11: 40 mg via SUBCUTANEOUS
  Filled 2019-02-10: qty 0.4

## 2019-02-10 MED ORDER — HEMOSTATIC AGENTS (NO CHARGE) OPTIME
TOPICAL | Status: DC | PRN
  Administered 2019-02-10: 1 via TOPICAL

## 2019-02-10 MED ORDER — SIMETHICONE 80 MG PO CHEW: 40.0000 mg | CHEWABLE_TABLET | Freq: Four times a day (QID) | ORAL | Status: DC | PRN

## 2019-02-10 MED ORDER — DEXAMETHASONE SODIUM PHOSPHATE 10 MG/ML IJ SOLN
INTRAMUSCULAR | Status: AC
  Filled 2019-02-10: qty 1

## 2019-02-10 MED ORDER — MIDAZOLAM HCL 2 MG/2ML IJ SOLN
INTRAMUSCULAR | Status: DC | PRN
  Administered 2019-02-10: 1 mg via INTRAVENOUS

## 2019-02-10 MED ORDER — SUGAMMADEX SODIUM 200 MG/2ML IV SOLN
INTRAVENOUS | Status: DC | PRN
  Administered 2019-02-10: 200 mg via INTRAVENOUS

## 2019-02-10 MED ORDER — PHENYLEPHRINE HCL (PRESSORS) 10 MG/ML IV SOLN
INTRAVENOUS | Status: DC | PRN
  Administered 2019-02-10: 40 ug via INTRAVENOUS
  Administered 2019-02-10 (×8): 80 ug via INTRAVENOUS

## 2019-02-10 MED ORDER — MENTHOL 3 MG MT LOZG
1.0000 | LOZENGE | OROMUCOSAL | Status: DC | PRN
  Administered 2019-02-10 (×2): 3 mg via ORAL
  Filled 2019-02-10 (×2): qty 9

## 2019-02-10 MED ORDER — ROCURONIUM BROMIDE 100 MG/10ML IV SOLN
INTRAVENOUS | Status: DC | PRN
  Administered 2019-02-10: 5 mg via INTRAVENOUS
  Administered 2019-02-10: 30 mg via INTRAVENOUS
  Administered 2019-02-10 (×4): 5 mg via INTRAVENOUS

## 2019-02-10 MED ORDER — MIDAZOLAM HCL 2 MG/2ML IJ SOLN
INTRAMUSCULAR | Status: AC
  Filled 2019-02-10: qty 2

## 2019-02-10 MED ORDER — ONDANSETRON 4 MG PO TBDP: 4.0000 mg | ORAL_TABLET | Freq: Four times a day (QID) | ORAL | Status: DC | PRN

## 2019-02-10 MED ORDER — PROPOFOL 10 MG/ML IV BOLUS
INTRAVENOUS | Status: AC
  Filled 2019-02-10: qty 40

## 2019-02-10 MED ORDER — PHENYLEPHRINE 40 MCG/ML (10ML) SYRINGE FOR IV PUSH (FOR BLOOD PRESSURE SUPPORT)
PREFILLED_SYRINGE | INTRAVENOUS | Status: AC
  Filled 2019-02-10: qty 10

## 2019-02-10 MED ORDER — HYDROMORPHONE HCL 1 MG/ML IJ SOLN
0.2500 mg | INTRAMUSCULAR | Status: DC | PRN
  Administered 2019-02-10 (×4): 0.5 mg via INTRAVENOUS
  Filled 2019-02-10 (×4): qty 0.5

## 2019-02-10 MED ORDER — DEXAMETHASONE SODIUM PHOSPHATE 4 MG/ML IJ SOLN
INTRAMUSCULAR | Status: DC | PRN
  Administered 2019-02-10: 8 mg via INTRAVENOUS

## 2019-02-10 MED ORDER — ONDANSETRON HCL 4 MG/2ML IJ SOLN
INTRAMUSCULAR | Status: AC
  Filled 2019-02-10: qty 2

## 2019-02-10 MED ORDER — AMLODIPINE BESYLATE 5 MG PO TABS
5.0000 mg | ORAL_TABLET | Freq: Every day | ORAL | Status: DC
  Administered 2019-02-11: 08:00:00 5 mg via ORAL
  Filled 2019-02-10: qty 1

## 2019-02-10 MED ORDER — LIDOCAINE 2% (20 MG/ML) 5 ML SYRINGE
INTRAMUSCULAR | Status: AC
  Filled 2019-02-10: qty 5

## 2019-02-10 MED ORDER — BUPIVACAINE HCL (PF) 0.5 % IJ SOLN
INTRAMUSCULAR | Status: AC
  Filled 2019-02-10: qty 30

## 2019-02-10 MED ORDER — HYDROCODONE-ACETAMINOPHEN 7.5-325 MG PO TABS: 1.0000 | ORAL_TABLET | Freq: Once | ORAL | Status: DC | PRN

## 2019-02-10 MED ORDER — SODIUM CHLORIDE 0.9 % IR SOLN
Status: DC | PRN
  Administered 2019-02-10: 1000 mL

## 2019-02-10 MED ORDER — SUCCINYLCHOLINE CHLORIDE 200 MG/10ML IV SOSY
PREFILLED_SYRINGE | INTRAVENOUS | Status: DC | PRN
  Administered 2019-02-10: 140 mg via INTRAVENOUS

## 2019-02-10 MED ORDER — GLYCOPYRROLATE PF 0.2 MG/ML IJ SOSY
PREFILLED_SYRINGE | INTRAMUSCULAR | Status: AC
  Filled 2019-02-10: qty 1

## 2019-02-10 MED ORDER — ONDANSETRON HCL 4 MG/2ML IJ SOLN
INTRAMUSCULAR | Status: DC | PRN
  Administered 2019-02-10: 4 mg via INTRAVENOUS

## 2019-02-10 MED ORDER — KETOROLAC TROMETHAMINE 30 MG/ML IJ SOLN
30.0000 mg | Freq: Four times a day (QID) | INTRAMUSCULAR | Status: AC
  Administered 2019-02-10: 13:00:00 30 mg via INTRAVENOUS
  Filled 2019-02-10: qty 1

## 2019-02-10 MED ORDER — SODIUM CHLORIDE 0.9 % IV SOLN
INTRAVENOUS | Status: DC
  Administered 2019-02-10 – 2019-02-11 (×3): via INTRAVENOUS

## 2019-02-10 SURGICAL SUPPLY — 53 items
APPLICATOR CHLORAPREP 10.5 ORG (MISCELLANEOUS) ×2 IMPLANT
APPLIER CLIP 9.375 SM OPEN (CLIP) ×2
ATTRACTOMAT 16X20 MAGNETIC DRP (DRAPES) ×2 IMPLANT
BLADE SURG 15 STRL LF DISP TIS (BLADE) ×1 IMPLANT
BLADE SURG 15 STRL SS (BLADE) ×1
CLIP APPLIE 9.375 SM OPEN (CLIP) ×1 IMPLANT
CLOTH BEACON ORANGE TIMEOUT ST (SAFETY) ×2 IMPLANT
COVER LIGHT HANDLE STERIS (MISCELLANEOUS) ×4 IMPLANT
COVER WAND RF STERILE (DRAPES) ×2 IMPLANT
DERMABOND ADVANCED (GAUZE/BANDAGES/DRESSINGS) ×1
DERMABOND ADVANCED .7 DNX12 (GAUZE/BANDAGES/DRESSINGS) ×1 IMPLANT
DRAPE HALF SHEET 40X57 (DRAPES) ×2 IMPLANT
DRAPE LAPAROTOMY 77X122 PED (DRAPES) ×2 IMPLANT
ELECT NEEDLE TIP 2.8 STRL (NEEDLE) ×2 IMPLANT
ELECT REM PT RETURN 9FT ADLT (ELECTROSURGICAL) ×2
ELECTRODE REM PT RTRN 9FT ADLT (ELECTROSURGICAL) ×1 IMPLANT
GAUZE 4X4 16PLY RFD (DISPOSABLE) ×6 IMPLANT
GLOVE BIO SURGEON STRL SZ 6.5 (GLOVE) ×2 IMPLANT
GLOVE BIO SURGEON STRL SZ7 (GLOVE) ×2 IMPLANT
GLOVE BIOGEL M 6.5 STRL (GLOVE) ×2 IMPLANT
GLOVE BIOGEL M 7.0 STRL (GLOVE) ×2 IMPLANT
GLOVE BIOGEL PI IND STRL 6.5 (GLOVE) ×1 IMPLANT
GLOVE BIOGEL PI IND STRL 7.0 (GLOVE) ×3 IMPLANT
GLOVE BIOGEL PI INDICATOR 6.5 (GLOVE) ×1
GLOVE BIOGEL PI INDICATOR 7.0 (GLOVE) ×3
GLOVE ECLIPSE 6.5 STRL STRAW (GLOVE) ×2 IMPLANT
GLOVE SURG SS PI 7.5 STRL IVOR (GLOVE) ×2 IMPLANT
GOWN STRL REUS W/ TWL XL LVL3 (GOWN DISPOSABLE) ×1 IMPLANT
GOWN STRL REUS W/TWL LRG LVL3 (GOWN DISPOSABLE) ×6 IMPLANT
GOWN STRL REUS W/TWL XL LVL3 (GOWN DISPOSABLE) ×1
HEMOSTAT SURGICEL 4X8 (HEMOSTASIS) ×2 IMPLANT
KIT BLADEGUARD II DBL (SET/KITS/TRAYS/PACK) ×2 IMPLANT
KIT TURNOVER KIT A (KITS) ×2 IMPLANT
MANIFOLD NEPTUNE II (INSTRUMENTS) ×2 IMPLANT
MARKER SKIN DUAL TIP RULER LAB (MISCELLANEOUS) ×2 IMPLANT
NEEDLE HYPO 22GX1.5 SAFETY (NEEDLE) ×2 IMPLANT
NS IRRIG 1000ML POUR BTL (IV SOLUTION) ×2 IMPLANT
PACK BASIC III (CUSTOM PROCEDURE TRAY) ×1
PACK SRG BSC III STRL LF ECLPS (CUSTOM PROCEDURE TRAY) ×1 IMPLANT
PAD ARMBOARD 7.5X6 YLW CONV (MISCELLANEOUS) ×2 IMPLANT
PENCIL HANDSWITCHING (ELECTRODE) ×2 IMPLANT
SET BASIN LINEN APH (SET/KITS/TRAYS/PACK) ×2 IMPLANT
SHEARS HARMONIC 9CM CVD (BLADE) ×2 IMPLANT
SPONGE DRAIN TRACH 4X4 STRL 2S (GAUZE/BANDAGES/DRESSINGS) ×2 IMPLANT
SPONGE INTESTINAL PEANUT (DISPOSABLE) ×14 IMPLANT
STAPLER VISISTAT 35W (STAPLE) ×2 IMPLANT
SUT MNCRL AB 4-0 PS2 18 (SUTURE) ×2 IMPLANT
SUT VIC AB 2-0 CT2 27 (SUTURE) ×2 IMPLANT
SUT VIC AB 3-0 SH 27 (SUTURE) ×1
SUT VIC AB 3-0 SH 27X BRD (SUTURE) ×1 IMPLANT
SYR 20ML LL LF (SYRINGE) ×2 IMPLANT
TOWEL OR 17X26 4PK STRL BLUE (TOWEL DISPOSABLE) ×2 IMPLANT
YANKAUER SUCT BULB TIP 10FT TU (MISCELLANEOUS) ×2 IMPLANT

## 2019-02-10 NOTE — Progress Notes (Signed)
Awake. Transferred self to bed with minimal assistance. Tolerated well. Transferred to rm 337 in good condition.

## 2019-02-10 NOTE — Plan of Care (Signed)

## 2019-02-10 NOTE — Transfer of Care (Signed)
Immediate Anesthesia Transfer of Care Note  Patient: Katherine Raymond  Procedure(s) Performed: TOTAL THYROIDECTOMY (N/A )  Patient Location: PACU  Anesthesia Type:General  Level of Consciousness: awake and patient cooperative  Airway & Oxygen Therapy: Patient Spontanous Breathing and Patient connected to nasal cannula oxygen  Post-op Assessment: Report given to RN and Post -op Vital signs reviewed and stable  Post vital signs: Reviewed and stable  Last Vitals:  Vitals Value Taken Time  BP 132/79 02/10/19 0952  Temp    Pulse 86 02/10/19 0955  Resp 17 02/10/19 0955  SpO2 100 % 02/10/19 0955  Vitals shown include unvalidated device data.  Last Pain:  Vitals:   02/10/19 0656  TempSrc: Oral  PainSc: 0-No pain         Complications: No apparent anesthesia complications

## 2019-02-10 NOTE — Anesthesia Postprocedure Evaluation (Signed)
Anesthesia Post Note  Patient: Katherine Raymond  Procedure(s) Performed: TOTAL THYROIDECTOMY (N/A )  Patient location during evaluation: PACU Anesthesia Type: General Level of consciousness: awake and oriented Pain management: pain level controlled Vital Signs Assessment: post-procedure vital signs reviewed and stable Respiratory status: spontaneous breathing Cardiovascular status: stable Postop Assessment: no apparent nausea or vomiting Anesthetic complications: no     Last Vitals:  Vitals:   02/10/19 1015 02/10/19 1030  BP: 131/64 131/82  Pulse: 85 74  Resp: 14 12  Temp:    SpO2: 99% 98%    Last Pain:  Vitals:   02/10/19 1015  TempSrc:   PainSc: 8                  Demani Weyrauch

## 2019-02-10 NOTE — Anesthesia Procedure Notes (Signed)
Procedure Name: Intubation Date/Time: 02/10/2019 7:40 AM Performed by: Georgeanne Nim, CRNA Pre-anesthesia Checklist: Patient identified, Emergency Drugs available, Suction available, Patient being monitored and Timeout performed Patient Re-evaluated:Patient Re-evaluated prior to induction Oxygen Delivery Method: Circle system utilized Preoxygenation: Pre-oxygenation with 100% oxygen Induction Type: IV induction Ventilation: Mask ventilation without difficulty Laryngoscope Size: Mac and 4 Grade View: Grade I Tube size: 7.0 mm Number of attempts: 1 Airway Equipment and Method: Stylet Placement Confirmation: ETT inserted through vocal cords under direct vision,  positive ETCO2,  CO2 detector and breath sounds checked- equal and bilateral Secured at: 21 cm Tube secured with: Tape Dental Injury: Teeth and Oropharynx as per pre-operative assessment

## 2019-02-10 NOTE — Anesthesia Preprocedure Evaluation (Signed)
Anesthesia Evaluation  Patient identified by MRN, date of birth, ID band Patient awake    Reviewed: Allergy & Precautions, NPO status , Patient's Chart, lab work & pertinent test results, reviewed documented beta blocker date and time   Airway Mallampati: III  TM Distance: >3 FB Neck ROM: Full    Dental no notable dental hx. (+) Partial Upper   Pulmonary neg pulmonary ROS,    Pulmonary exam normal breath sounds clear to auscultation       Cardiovascular Exercise Tolerance: Good hypertension, Pt. on medications and Pt. on home beta blockers negative cardio ROS Normal cardiovascular examI Rhythm:Regular Rate:Normal     Neuro/Psych negative neurological ROS  negative psych ROS   GI/Hepatic negative GI ROS, Neg liver ROS,   Endo/Other  Hypothyroidism TSH normal -here for total thyroidectomy -on synthroid  Renal/GU negative Renal ROS  negative genitourinary   Musculoskeletal negative musculoskeletal ROS (+)   Abdominal   Peds negative pediatric ROS (+)  Hematology negative hematology ROS (+)   Anesthesia Other Findings   Reproductive/Obstetrics negative OB ROS                             Anesthesia Physical Anesthesia Plan  ASA: II  Anesthesia Plan: General   Post-op Pain Management:    Induction: Intravenous  PONV Risk Score and Plan: 3 and Midazolam, Ondansetron, Dexamethasone and Treatment may vary due to age or medical condition  Airway Management Planned: Oral ETT  Additional Equipment:   Intra-op Plan:   Post-operative Plan: Extubation in OR  Informed Consent: I have reviewed the patients History and Physical, chart, labs and discussed the procedure including the risks, benefits and alternatives for the proposed anesthesia with the patient or authorized representative who has indicated his/her understanding and acceptance.     Dental advisory given  Plan Discussed  with: CRNA  Anesthesia Plan Comments: (Plan Full PPE use Plan GETA D/W PT -WTP with same after Q&A)        Anesthesia Quick Evaluation

## 2019-02-10 NOTE — Op Note (Signed)
Patient:  Katherine Raymond  DOB:  08-Sep-1955  MRN:  [id number]   Preop Diagnosis: Multinodular goiter  Postop Diagnosis: Same  Procedure: Total thyroidectomy  Surgeon: Aviva Signs, MD  Assistant: Curlene Labrum, MD  Anes: General endotracheal  Indications: Patient is a 63 year old black female who presents with a large multinodular goiter.  She also suffers from dysphasia and voice changes.  The risks and benefits of the procedure including bleeding, infection, nerve injury, voice changes, and the possibility of malignancy were fully explained to the patient, who gave informed consent.  Procedure note: The patient was placed in supine position.  After induction of general endotracheal anesthesia, the neck was extended and prepped and draped using usual sterile technique with ChloraPrep.  Surgical site confirmation was performed.  A transverse incision was made anteriorly in the skin crease approximately 1 to 2 fingerbreadths above the jugular notch.  The platysma was divided without difficulty.  The strap muscles were separated longitudinally over the trachea.  We first turned our attention to the left lobe.  It was extremely enlarged and lobulated.  This did distort the anatomy.  The strap muscles on the left side had to be divided in order to facilitate exposure.  As the left lobe was being delivered out of the bed, and the vasculature was divided using the harmonic scalpel.  The suspensory ligament was identified and clips were placed proximally and distally on the suspensory ligament and divided.  Care was taken to avoid the left recurrent laryngeal nerve.  Multiple cysts were found and these were evacuated to help assist with the dissection.  Once the left lobe was rotated over the trachea.  It was freed away from the trachea and sent to pathology further examination.  Next, we turned our attention to the right lobe.  This was similar to the left lobe.  It appeared that both lobes were  somewhat encapsulated with fibrinous material.  This was divided using the harmonic scalpel.  Again, any vascular structures were isolated, clipped, and divided using the harmonic scalpel.  Once it was rotated over towards the trachea, it was removed using the harmonic scalpel without difficulty.  On inspection of the bed of the right thyroid lobe, it appeared that the right recurrent laryngeal nerve may have been in the field of dissection.  2 small endoclips were removed and the possible nerve was noted to be intact.  Both thyroid beds were irrigated with normal saline.  No significant bleeding was noted at the end of the procedure.  Surgicel was placed in both thyroid beds.  The deeper platysma fascial edges were reapproximated using 2-0 Vicryl interrupted sutures.  The remaining platysma was reapproximated using 3-0 Vicryl running suture.  Exparel was instilled into the surrounding wound.  The skin was closed using a 4-0 Monocryl subcuticular suture.  Dermabond was applied.  All tape and needle counts were correct at the end the procedure.  The patient was extubated in the operating room and transferred to PACU in stable condition.  Her airway was patent and she was able to phonate the T in a similar fashion as she was able to preoperatively.  Complications: None  EBL: 150 cc  Specimen: Left lobe of thyroid, right lobe of thyroid

## 2019-02-10 NOTE — Interval H&P Note (Signed)
History and Physical Interval Note:  02/10/2019 7:14 AM  Katherine Raymond  has presented today for surgery, with the diagnosis of multinodular goiter.  The various methods of treatment have been discussed with the patient and family. After consideration of risks, benefits and other options for treatment, the patient has consented to  Procedure(s): TOTAL THYROIDECTOMY (N/A) as a surgical intervention.  The patient's history has been reviewed, patient examined, no change in status, stable for surgery.  I have reviewed the patient's chart and labs.  Questions were answered to the patient's satisfaction.     Aviva Signs

## 2019-02-11 ENCOUNTER — Observation Stay (HOSPITAL_COMMUNITY): Payer: BLUE CROSS/BLUE SHIELD

## 2019-02-11 DIAGNOSIS — E042 Nontoxic multinodular goiter: Secondary | ICD-10-CM | POA: Diagnosis not present

## 2019-02-11 LAB — CBC
HCT: 32.6 % — ABNORMAL LOW (ref 36.0–46.0)
Hemoglobin: 10.9 g/dL — ABNORMAL LOW (ref 12.0–15.0)
MCH: 29.4 pg (ref 26.0–34.0)
MCHC: 33.4 g/dL (ref 30.0–36.0)
MCV: 87.9 fL (ref 80.0–100.0)
Platelets: 294 10*3/uL (ref 150–400)
RBC: 3.71 MIL/uL — ABNORMAL LOW (ref 3.87–5.11)
RDW: 12.5 % (ref 11.5–15.5)
WBC: 10.6 10*3/uL — ABNORMAL HIGH (ref 4.0–10.5)
nRBC: 0 % (ref 0.0–0.2)

## 2019-02-11 LAB — COMPREHENSIVE METABOLIC PANEL
ALT: 170 U/L — ABNORMAL HIGH (ref 0–44)
AST: 83 U/L — ABNORMAL HIGH (ref 15–41)
Albumin: 3.4 g/dL — ABNORMAL LOW (ref 3.5–5.0)
Alkaline Phosphatase: 67 U/L (ref 38–126)
Anion gap: 7 (ref 5–15)
BUN: 12 mg/dL (ref 8–23)
CO2: 26 mmol/L (ref 22–32)
Calcium: 8.5 mg/dL — ABNORMAL LOW (ref 8.9–10.3)
Chloride: 108 mmol/L (ref 98–111)
Creatinine, Ser: 0.84 mg/dL (ref 0.44–1.00)
GFR calc Af Amer: >60 mL/min (ref >60)
GFR calc non Af Amer: >60 mL/min (ref >60)
Glucose, Bld: 105 mg/dL — ABNORMAL HIGH (ref 70–99)
Potassium: 3.5 mmol/L (ref 3.5–5.1)
Sodium: 141 mmol/L (ref 135–145)
Total Bilirubin: 1.8 mg/dL — ABNORMAL HIGH (ref 0.3–1.2)
Total Protein: 6.6 g/dL (ref 6.5–8.1)

## 2019-02-11 LAB — PHOSPHORUS: Phosphorus: 4.2 mg/dL (ref 2.5–4.6)

## 2019-02-11 LAB — MAGNESIUM: Magnesium: 1.7 mg/dL (ref 1.7–2.4)

## 2019-02-11 MED ORDER — LEVOTHYROXINE SODIUM 100 MCG PO TABS: 100.0000 ug | ORAL_TABLET | Freq: Every day | ORAL | 1 refills | Status: AC

## 2019-02-11 MED ORDER — OSCAL 500/200 D-3 500-200 MG-UNIT PO TABS: 2.0000 | ORAL_TABLET | Freq: Two times a day (BID) | ORAL | 1 refills | Status: AC

## 2019-02-11 MED ORDER — HYDROCODONE-ACETAMINOPHEN 5-325 MG PO TABS: 1.0000 | ORAL_TABLET | ORAL | 0 refills | Status: AC | PRN

## 2019-02-11 NOTE — Discharge Instructions (Signed)
Thyroidectomy, Care After °This sheet gives you information about how to care for yourself after your procedure. Your health care provider may also give you more specific instructions. If you have problems or questions, contact your health care provider. °What can I expect after the procedure? °After the procedure, it is common to have: °· Mild pain in the neck or upper body, especially when swallowing. °· A swollen neck. °· A sore throat. °· A weak or hoarse voice. °· Slight tingling or numbness around your mouth, or in your fingers or toes. This may last for a day or two after surgery. This condition is caused by low levels of calcium. You may be given calcium supplements to treat it. °Follow these instructions at home: ° °Medicines °· Take over-the-counter and prescription medicines only as told by your health care provider. °· Do not drive or use heavy machinery while taking prescription pain medicine. °· Do not take medicines that contain aspirin and ibuprofen until your health care provider says that you can. These medicines can increase your risk of bleeding. °· Take a thyroid hormone medicine as recommended by your health care provider. You will have to take this medicine for the rest of your life if your entire thyroid was removed. °Eating and drinking °· Start slowly with eating. You may need to have only liquids and soft foods for a few days or as directed by your health care provider. °· To prevent or treat constipation while you are taking prescription pain medicine, your health care provider may recommend that you: °? Drink enough fluid to keep your urine pale yellow. °? Take over-the-counter or prescription medicines. °? Eat foods that are high in fiber, such as fresh fruits and vegetables, whole grains, and beans. °? Limit foods that are high in fat and processed sugars, such as fried and sweet foods. °Incision care °· Follow instructions from your health care provider about how to take care of your  incision. Make sure you: °? Wash your hands with soap and water before you change your bandage (dressing). If soap and water are not available, use hand sanitizer. °? Change your dressing as told by your health care provider. °? Leave stitches (sutures), skin glue, or adhesive strips in place. These skin closures may need to stay in place for 2 weeks or longer. If adhesive strip edges start to loosen and curl up, you may trim the loose edges. Do not remove adhesive strips completely unless your health care provider tells you to do that. °· Check your incision area every day for signs of infection. Check for: °? Redness, swelling, or pain. °? Fluid or blood. °? Warmth. °? Pus or a bad smell. °· Do not take baths, swim, or use a hot tub until your health care provider approves. °Activity °· For the first 10 days after the procedure or as instructed by your health care provider: °? Do not lift anything that is heavier than 10 lb (4.5 kg). °? Do not jog, swim, or do other strenuous exercises. °? Do not play contact sports. °· Avoid sitting for a long time without moving. Get up to take short walks every 1-2 hours. This is needed to improve blood flow and breathing. Ask for help if you feel weak or unsteady. °· Return to your normal activities as told by your health care provider. Ask your health care provider what activities are safe for you. °General instructions °· Do not use any products that contain nicotine or tobacco, such as   cigarettes and e-cigarettes. These can delay healing after surgery. If you need help quitting, ask your health care provider. °· Keep all follow-up visits as told by your health care provider. This is important. Your health care provider needs to monitor the calcium level in your blood to make sure that it does not become low. °Contact a health care provider if you: °· Have a fever. °· Have more redness, swelling, or pain around your incision area. °· Have fluid or blood coming from your  incision area. °· Notice that your incision area feels warm to the touch. °· Have pus or a bad smell coming from your incision area. °· Have trouble talking. °· Have nausea or vomiting for more than 2 days. °Get help right away if you: °· Have trouble breathing. °· Have trouble swallowing. °· Develop a rash. °· Develop a cough that gets worse. °· Notice that your speech changes, or you have hoarseness that gets worse. °· Develop numbness, tingling, or muscle spasms in the arms, hands, feet, or face. °Summary °· After the procedure, it is common to feel mild pain in the neck or upper body, especially when swallowing. °· Take medicines as told by your health care provider. These include pain medicines and thyroid hormones, if required. °· Follow instructions from your health care provider about how to take care of your incision. Watch for signs of infection. °· Keep all follow-up visits as told by your health care provider. This is important. Your health care provider needs to monitor the calcium level in your blood to make sure that it does not become low. °· Get help right away if you develop difficulty breathing, or numbness, tingling, or muscle spasms in the arms, hands, feet, or face. °This information is not intended to replace advice given to you by your health care provider. Make sure you discuss any questions you have with your health care provider. °Document Released: 12/05/2004 Document Revised: 07/14/2018 Document Reviewed: 03/23/2017 °Elsevier Patient Education © 2020 [employer] ° °

## 2019-02-11 NOTE — Progress Notes (Signed)
Surgical site to neck dry and intact with surgical glue. Stated pain rated about a 6 but declined pain medication.  Difficulty swallowing pills so pills were crushed and put in applesauce.  To be discharged home today.

## 2019-02-11 NOTE — Discharge Summary (Signed)
Physician Discharge Summary  Patient ID: Katherine Raymond MRN: [id number] DOB/AGE: Dec 01, 1955 63 y.o.  Admit date: 02/10/2019 Discharge date: 02/11/2019  Admission Diagnoses: Multinodular goiter  Discharge Diagnoses: Same, transaminitis Active Problems:   Multinodular goiter   S/P total thyroidectomy   Discharged Condition: good  Hospital Course: Patient is a 63 year old black female who underwent a total thyroidectomy for multinodular goiter on 02/10/2019.  She tolerated the procedure well.  Her postoperative calciums remained within normal limits.  She was noted to have an acute elevation of her liver enzyme tests.  She has had her gallbladder out in the past.  Ultrasound was performed which revealed a fatty liver.  Her common bile duct was within normal limits.  Her transaminases were improving on postoperative day 1.  Her voice was unchanged from preoperatively, which consisted of mild laryngitis.  She will be discharged home on 02/11/2019 in good and improving condition.  Treatments: surgery: Total thyroidectomy on 02/10/2019  Discharge Exam: Blood pressure (!) 176/91, pulse (!) 102, temperature 98.9 F (37.2 C), temperature source Oral, resp. rate 19, height 5\' 4"  (1.626 m), weight 89.3 kg, SpO2 99 %. General appearance: alert, cooperative and no distress Neck: Incision healing well without tense swelling in the neck.  Ecchymosis present around the incision. Resp: clear to auscultation bilaterally Cardio: regular rate and rhythm, S1, S2 normal, no murmur, click, rub or gallop  Disposition: Discharge disposition: 01-Home or Self Care       Discharge Instructions    Diet - low sodium heart healthy   Complete by: As directed    Increase activity slowly   Complete by: As directed      Allergies as of 02/11/2019      Reactions   Pantoprazole    Rash under breast. Taste bud problems.       Medication List    TAKE these medications   ALPRAZolam 0.5 MG tablet Commonly known  as: XANAX Take 0.5 mg by mouth 3 (three) times daily as needed for anxiety.   amLODipine 5 MG tablet Commonly known as: NORVASC Take 5 mg by mouth daily.   cetirizine 10 MG tablet Commonly known as: ZYRTEC Take 10 mg by mouth daily as needed for allergies.   HYDROcodone-acetaminophen 5-325 MG tablet Commonly known as: Norco Take 1 tablet by mouth every 4 (four) hours as needed for moderate pain.   labetalol 200 MG tablet Commonly known as: NORMODYNE Take 200 mg by mouth 2 (two) times daily.   levothyroxine 100 MCG tablet Commonly known as: Synthroid Take 1 tablet (100 mcg total) by mouth daily. What changed:   medication strength  how much to take  when to take this   MULTI-DAY PO Take 1 tablet by mouth 3 (three) times a week.   Oscal 500/200 D-3 500-200 MG-UNIT tablet Generic drug: calcium-vitamin D Take 2 tablets by mouth 2 (two) times daily.   spironolactone 25 MG tablet Commonly known as: ALDACTONE Take 25 mg by mouth daily.   triamcinolone cream 0.1 % Commonly known as: KENALOG Apply 1 application topically 2 (two) times daily as needed (rash).      Follow-up Information    Aviva Signs, MD. Schedule an appointment as soon as possible for a visit on 02/16/2019.   Specialty: General Surgery Contact information: [id number] [place] [address] [phone]           Signed: Aviva Signs 02/11/2019, 9:09 AM

## 2019-02-13 ENCOUNTER — Encounter (HOSPITAL_COMMUNITY): Payer: Self-pay | Admitting: General Surgery

## 2019-02-13 NOTE — Addendum Note (Signed)
Addendum  created 02/13/19 0034 by Mickel Baas, CRNA   Charge Capture section accepted

## 2019-02-14 ENCOUNTER — Telehealth: Payer: Self-pay

## 2019-02-14 ENCOUNTER — Other Ambulatory Visit (INDEPENDENT_AMBULATORY_CARE_PROVIDER_SITE_OTHER): Payer: Self-pay | Admitting: General Surgery

## 2019-02-14 DIAGNOSIS — Z09 Encounter for follow-up examination after completed treatment for conditions other than malignant neoplasm: Secondary | ICD-10-CM

## 2019-02-14 MED ORDER — TRAMADOL HCL 50 MG PO TABS: 50.0000 mg | ORAL_TABLET | Freq: Four times a day (QID) | ORAL | 0 refills | Status: AC | PRN

## 2019-02-14 NOTE — Telephone Encounter (Signed)
Patient called to schedule post op apt. Would like to know if we can prescribe something else for pain as the norco made her itch. MD notified.

## 2019-02-21 ENCOUNTER — Encounter: Payer: Self-pay | Admitting: General Surgery

## 2019-02-21 ENCOUNTER — Other Ambulatory Visit: Payer: Self-pay

## 2019-02-21 ENCOUNTER — Ambulatory Visit (INDEPENDENT_AMBULATORY_CARE_PROVIDER_SITE_OTHER): Payer: Self-pay | Admitting: General Surgery

## 2019-02-21 VITALS — BP 171/104 | HR 88 | Temp 97.3°F | Resp 16 | Ht 64.0 in | Wt 192.0 lb

## 2019-02-21 DIAGNOSIS — Z09 Encounter for follow-up examination after completed treatment for conditions other than malignant neoplasm: Secondary | ICD-10-CM

## 2019-02-21 NOTE — Progress Notes (Signed)
Subjective:     Katherine Raymond  Here for postoperative visit.  Patient states she is doing well.  Her dysphasia has resolved.  She still has mild laryngitis like she had preoperatively.  She does feel that her voice sometimes tires out more quickly, though it is better since immediately postoperatively.  She is taking her Synthroid and calcium supplements.  She denies any incisional pain. Objective:    BP (!) 171/104 (BP Location: Left Arm, Patient Position: Sitting, Cuff Size: Normal)   Pulse 88   Temp (!) 97.3 F (36.3 C) (Tympanic)   Resp 16   Ht 5\' 4"  (1.626 m)   Wt 192 lb (87.1 kg)   SpO2 99%   BMI 32.96 kg/m   General:  alert, cooperative and no distress   Her voice is somewhat scratchy, but seems improved since preop.  She is able to phonate the letter E without difficulty.  No airway compromise is noted. Neck is supple.  Incision healing well.  No ecchymosis present.  No swelling is present.  No hematoma present. Final pathology is negative for malignancy.     Assessment:    Doing well postoperatively.    Plan:   We will see patient again in 3 weeks for follow-up.  I reassured the patient that her voice should become stronger with time.  She already has a follow-up appointment with Dr. Dorris Fetch of endocrinology next month.

## 2019-03-14 ENCOUNTER — Other Ambulatory Visit: Payer: Self-pay

## 2019-03-14 ENCOUNTER — Ambulatory Visit (INDEPENDENT_AMBULATORY_CARE_PROVIDER_SITE_OTHER): Payer: Self-pay | Admitting: General Surgery

## 2019-03-14 ENCOUNTER — Encounter: Payer: Self-pay | Admitting: General Surgery

## 2019-03-14 VITALS — BP 136/90 | HR 88 | Temp 97.7°F | Resp 16 | Ht 64.0 in | Wt 194.0 lb

## 2019-03-14 DIAGNOSIS — Z09 Encounter for follow-up examination after completed treatment for conditions other than malignant neoplasm: Secondary | ICD-10-CM

## 2019-03-14 NOTE — Progress Notes (Signed)
Subjective:     Katherine Raymond  Patient here for postoperative visit.  Her voice has returned to normal.  She states only significantly high loud speaking can sometimes make her tired.  She deftly has seen much improvement.  She is pleased with the results. Objective:    BP 136/90 (BP Location: Left Arm, Patient Position: Sitting, Cuff Size: Normal)   Pulse 88   Temp 97.7 F (36.5 C) (Tympanic)   Resp 16   Ht 5\' 4"  (1.626 m)   Wt 194 lb (88 kg)   SpO2 99%   BMI 33.30 kg/m   General:  alert, cooperative and no distress  Neck is soft with full range of motion.  Incision has healed well.     Assessment:    Doing well postoperatively.    Plan:   To see Dr. Dorris Fetch in 2 weeks.  Follow-up here as needed.

## 2019-03-23 ENCOUNTER — Ambulatory Visit: Payer: BLUE CROSS/BLUE SHIELD | Admitting: "Endocrinology

## 2019-03-29 ENCOUNTER — Ambulatory Visit: Payer: BLUE CROSS/BLUE SHIELD | Admitting: "Endocrinology

## 2019-04-04 LAB — TSH: TSH: 1.24 mIU/L (ref 0.40–4.50)

## 2019-04-04 LAB — T4, FREE: Free T4: 1.5 ng/dL (ref 0.8–1.8)

## 2019-04-14 ENCOUNTER — Other Ambulatory Visit: Payer: Self-pay | Admitting: General Surgery

## 2019-10-11 IMAGING — US US THYROID
1 series · 15 of 25 positions shown · non-contrast
Comparison: None.

CLINICAL DATA: Goiter. Large compressive multinodular goiter.
History of hypothyroidism.

EXAM:
THYROID ULTRASOUND
TECHNIQUE: Ultrasound examination of the thyroid gland and adjacent soft
tissues was performed.

[Series 1: us thyroid · 0.07mm/px · 101 acquisitions, 15 frames shown]
[im 1/101]
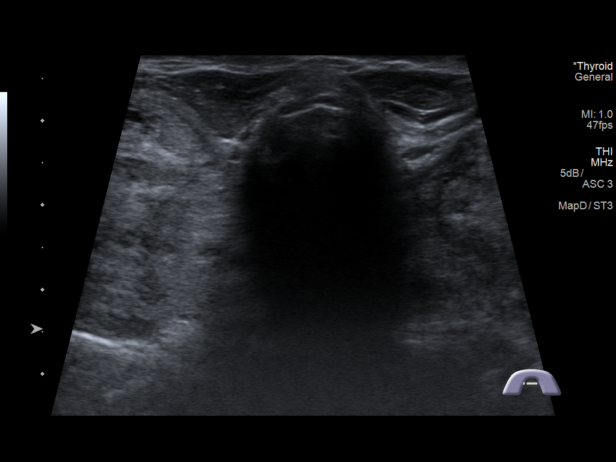
[im 9/101]
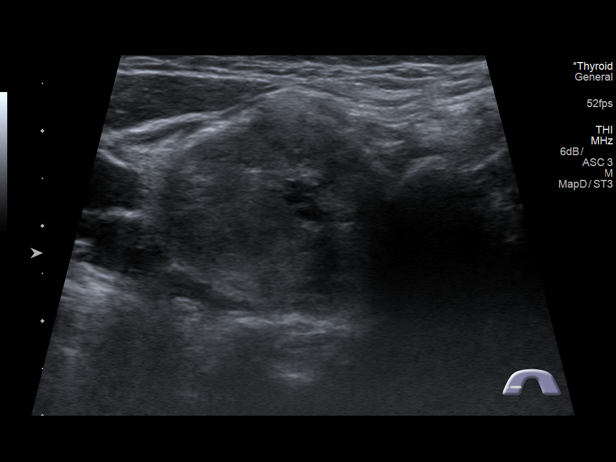
[im 17/101]
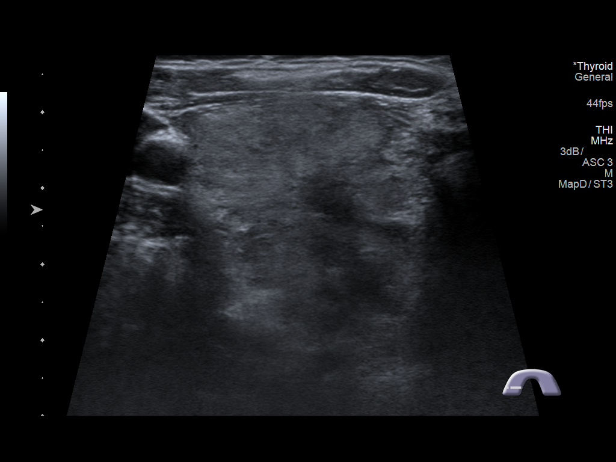
[im 21/101]
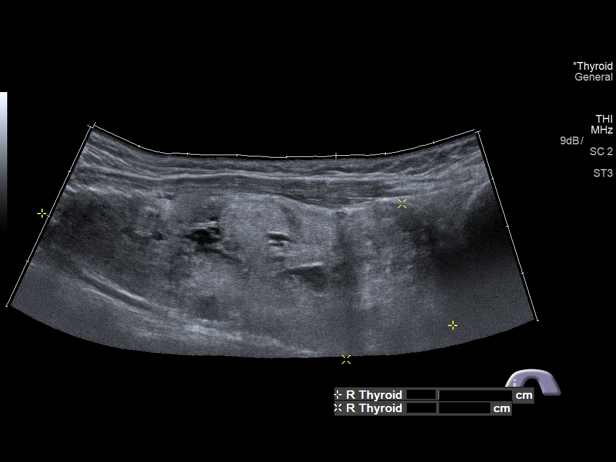
[im 30/101]
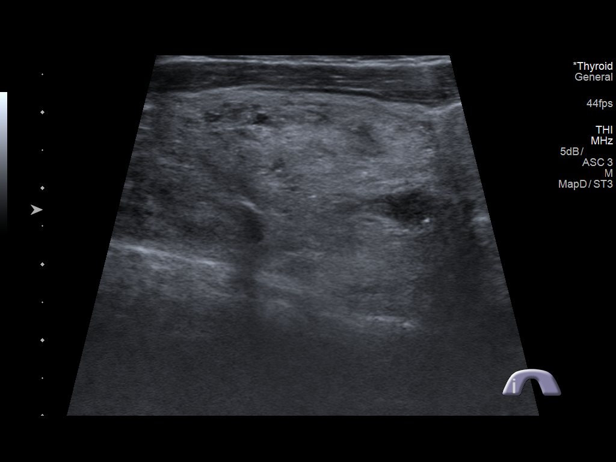
[im 38/101]
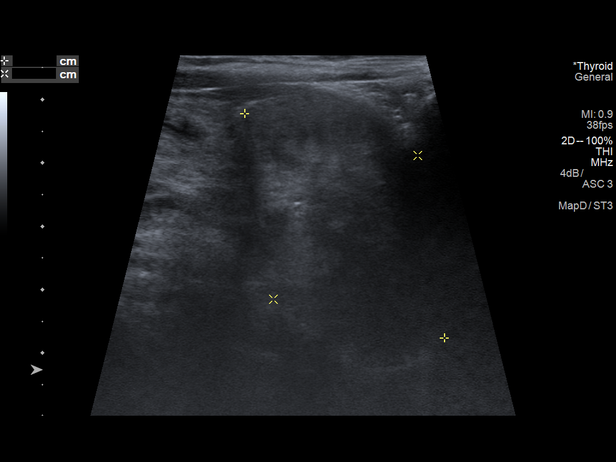
[im 42/101]
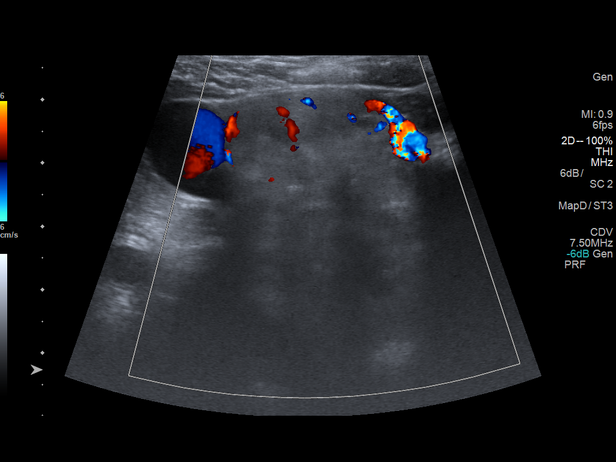
[im 51/101]
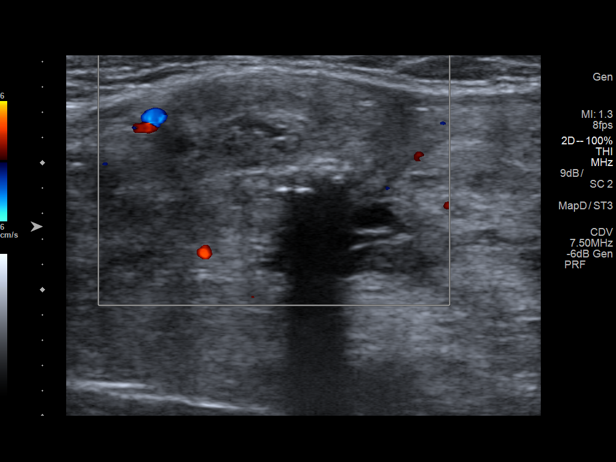
[im 59/101]
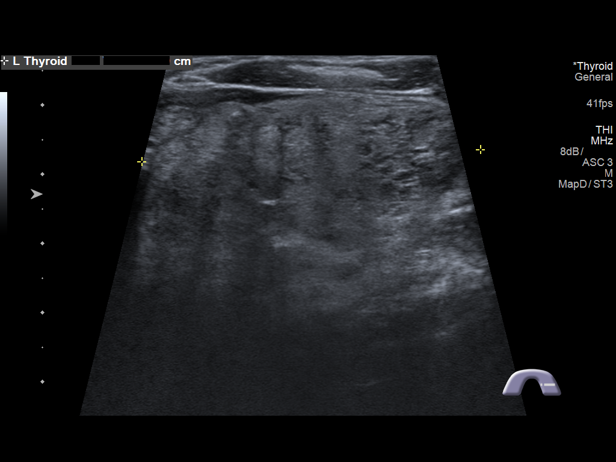
[im 63/101]
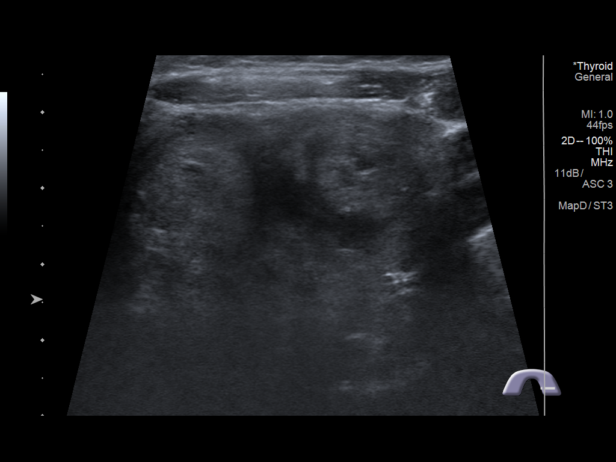
[im 71/101]
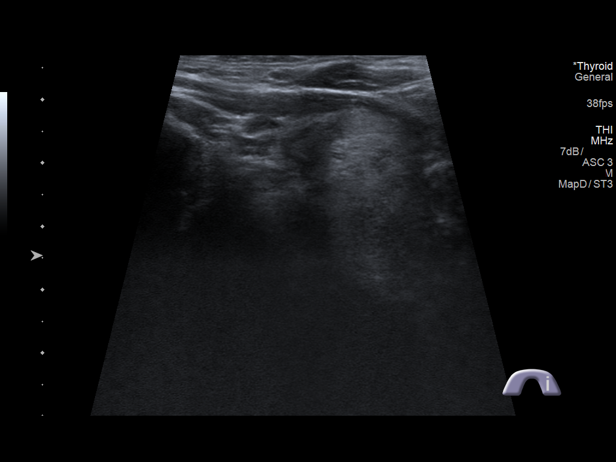
[im 80/101]
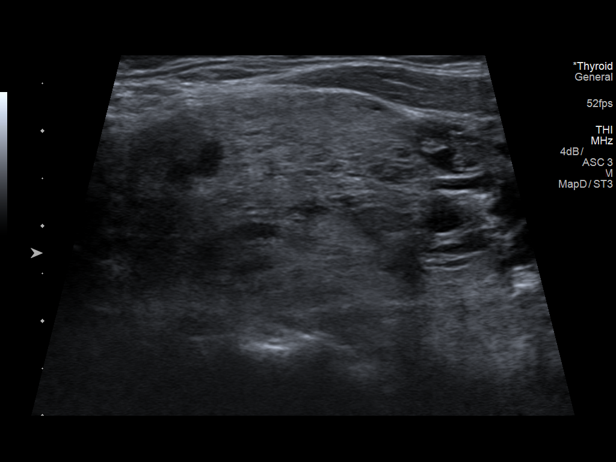
[im 84/101]
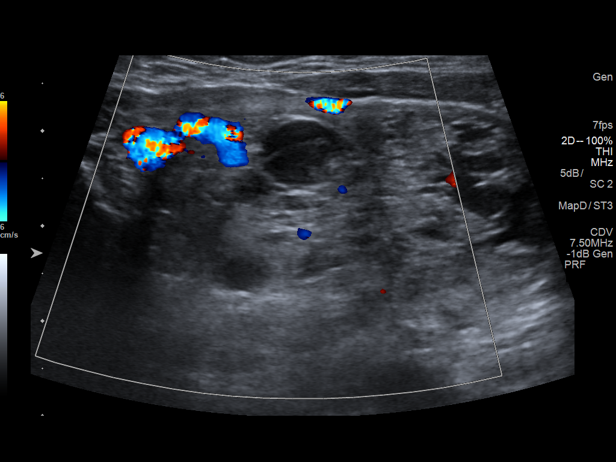
[im 92/101]
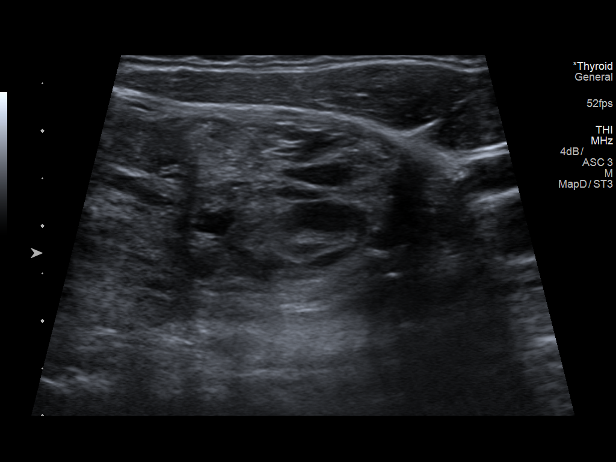
[im 101/101]
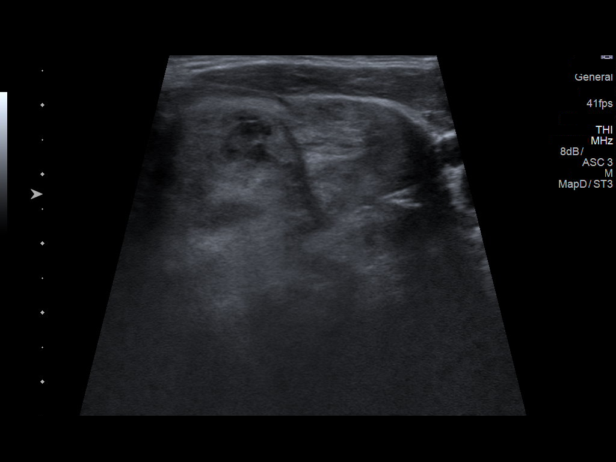

[15 of 25 positions shown; findings below may reference images not displayed]

FINDINGS: Parenchymal Echotexture: Markedly heterogenous

Isthmus: Normal in size measuring 0.2 cm in diameter

Right lobe: Enlarged measuring 8.6 x 3.3 x 4.2 cm

Left lobe: There is enlarged measuring 8.0 x 3.1 x 4.9 cm

_________________________________________________________

Estimated total number of nodules >/= 1 cm: >10

Number of spongiform nodules >/=  2 cm not described below (TR1): 0

Number of mixed cystic and solid nodules >/= 1.5 cm not described
below (TR2): 0

_________________________________________________________

Nodule # 1:

Location: Right; Mid

Maximum size: 4.3 cm; Other 2 dimensions: 3.4 x 2.5 cm

Composition: solid/almost completely solid (2)

Echogenicity: isoechoic (1)

Shape: not taller-than-wide (0)

Margins: ill-defined (0)

Echogenic foci: none (0)

ACR TI-RADS total points: 3.

ACR TI-RADS risk category: TR3 (3 points).

ACR TI-RADS recommendations:

**Given size (>/= 2.5 cm) and appearance, fine needle aspiration of
this mildly suspicious nodule should be considered based on TI-RADS
criteria.

_________________________________________________________

Nodule # 2:

Location: Right; Inferior

Maximum size: 4.7 cm; Other 2 dimensions: 4.2 x 3.3 cm

Composition: solid/almost completely solid (2)

Echogenicity: hypoechoic (2)

Shape: taller-than-wide (3)

Margins: ill-defined (0)

Echogenic foci: none (0)

ACR TI-RADS total points: 7.

ACR TI-RADS risk category: TR5 (>/= 7 points).

ACR TI-RADS recommendations:

**Given size (>/= 1.0 cm) and appearance, fine needle aspiration of
this highly suspicious nodule should be considered based on TI-RADS
criteria.

_________________________________________________________

Nodule # 3:

Location: Right; Superior

Maximum size: 2.6 cm; Other 2 dimensions: 2.5 x 2.0 cm

Composition: solid/almost completely solid (2)

Echogenicity: hypoechoic (2)

Shape: not taller-than-wide (0)

Margins: ill-defined (0)

Echogenic foci: none (0)

ACR TI-RADS total points: 4.

ACR TI-RADS risk category: TR4 (4-6 points).

ACR TI-RADS recommendations:

**Given size (>/= 1.5 cm) and appearance, fine needle aspiration of
this moderately suspicious nodule should be considered based on
TI-RADS criteria.

_________________________________________________________

Nodule # 4:

Location: Right; Superior

Maximum size: 2.2 cm; Other 2 dimensions: 1.9 x 1.3 cm

Composition: solid/almost completely solid (2)

Echogenicity: hypoechoic (2)

Shape: not taller-than-wide (0)

Margins: ill-defined (0)

Echogenic foci: macrocalcifications (1)

ACR TI-RADS total points: 5.

ACR TI-RADS risk category: TR4 (4-6 points).

ACR TI-RADS recommendations:

**Given size (>/= 1.5 cm) and appearance, fine needle aspiration of
this moderately suspicious nodule should be considered based on
TI-RADS criteria.

_________________________________________________________

Nodule # 5:

Location: Left; Superior

Maximum size: 3.3 cm; Other 2 dimensions: 3.1 x 1.8 cm

Composition: solid/almost completely solid (2)

Echogenicity: isoechoic (1)

Shape: not taller-than-wide (0)

Margins: ill-defined (0)

Echogenic foci: none (0)

ACR TI-RADS total points: 3.

ACR TI-RADS risk category: TR3 (3 points).

ACR TI-RADS recommendations:

**Given size (>/= 2.5 cm) and appearance, fine needle aspiration of
this mildly suspicious nodule should be considered based on TI-RADS
criteria.

_________________________________________________________

There is an approximately 3.3 x 2.7 x 2.4 cm ill-defined
spongiform/benign-appearing nodule/pseudonodule within mid aspect
the left lobe of the thyroid (labeled 6), which does not meet
imaging criteria to recommend percutaneous sampling or continued
dedicated follow-up.

There is an approximately 2.2 x 2.1 x 2.0 cm ill-defined
spongiform/benign-appearing nodule/pseudonodule within mid aspect
the left lobe of the thyroid (labeled 7), which does not meet
imaging criteria to recommend percutaneous sampling or continued
dedicated follow-up.

_________________________________________________________

Nodule # 8:

Location: Left; Inferior

Maximum size: 2.8 cm; Other 2 dimensions: 2.5 x 2.2 cm

Composition: solid/almost completely solid (2)

Echogenicity: isoechoic (1)

Shape: not taller-than-wide (0)

Margins: ill-defined (0)

Echogenic foci: none (0)

ACR TI-RADS total points: 3.

ACR TI-RADS risk category: TR3 (3 points).

ACR TI-RADS recommendations:

**Given size (>/= 2.5 cm) and appearance, fine needle aspiration of
this mildly suspicious nodule should be considered based on TI-RADS
criteria.

_________________________________________________________
IMPRESSION: 1. Findings suggestive of multinodular goiter.
2. Multiple bilateral nodules/pseudo nodules meet imaging criteria
to recommend percutaneous sampling, however current imaging
guidelines are to only biopsy the two most worrisome thyroid nodules
at a given time. As such, nodules #2 and #4 could undergo
ultrasound-guided fine-needle aspiration as clinically indicated.

The above is in keeping with the ACR TI-RADS recommendations - [HOSPITAL] 4635;[DATE].

## 2020-10-30 IMAGING — DX DG CHEST 2V
2 series · 2 of 2 positions shown · non-contrast
Comparison: None.

CLINICAL DATA: Pre op/no chest complaints/non smoker/htn

EXAM:
CHEST - 2 VIEW

[chest pa]
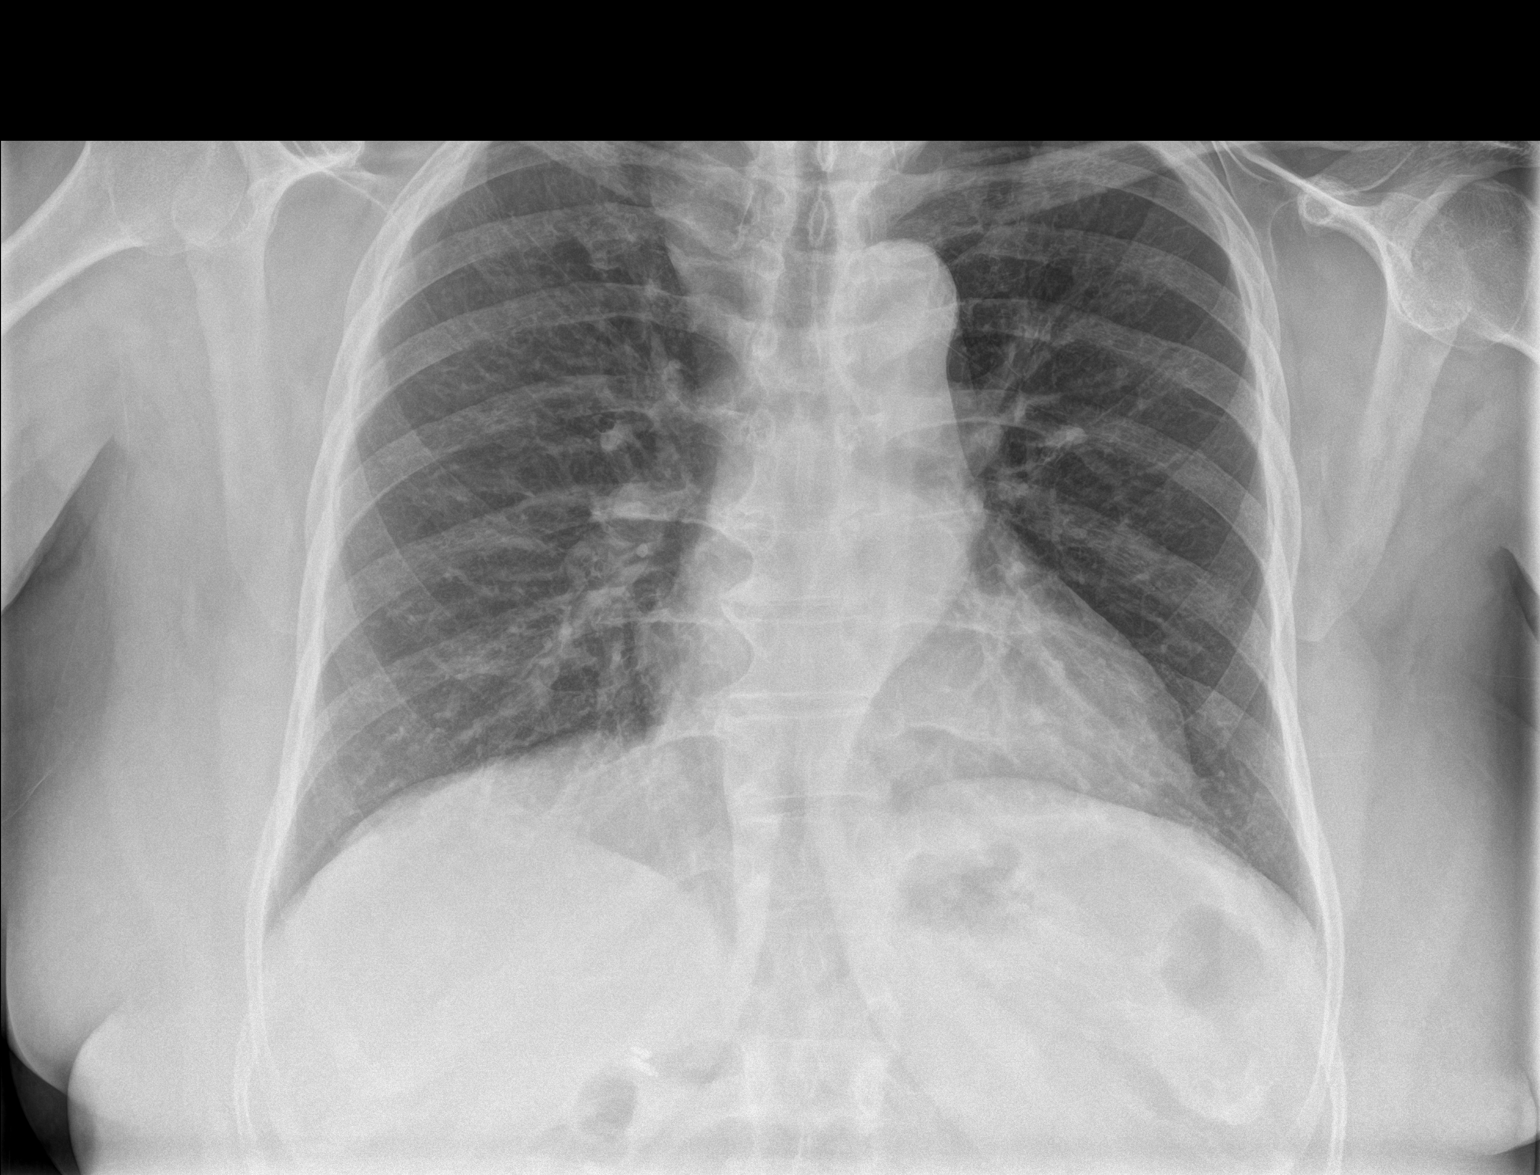

[chest lat]
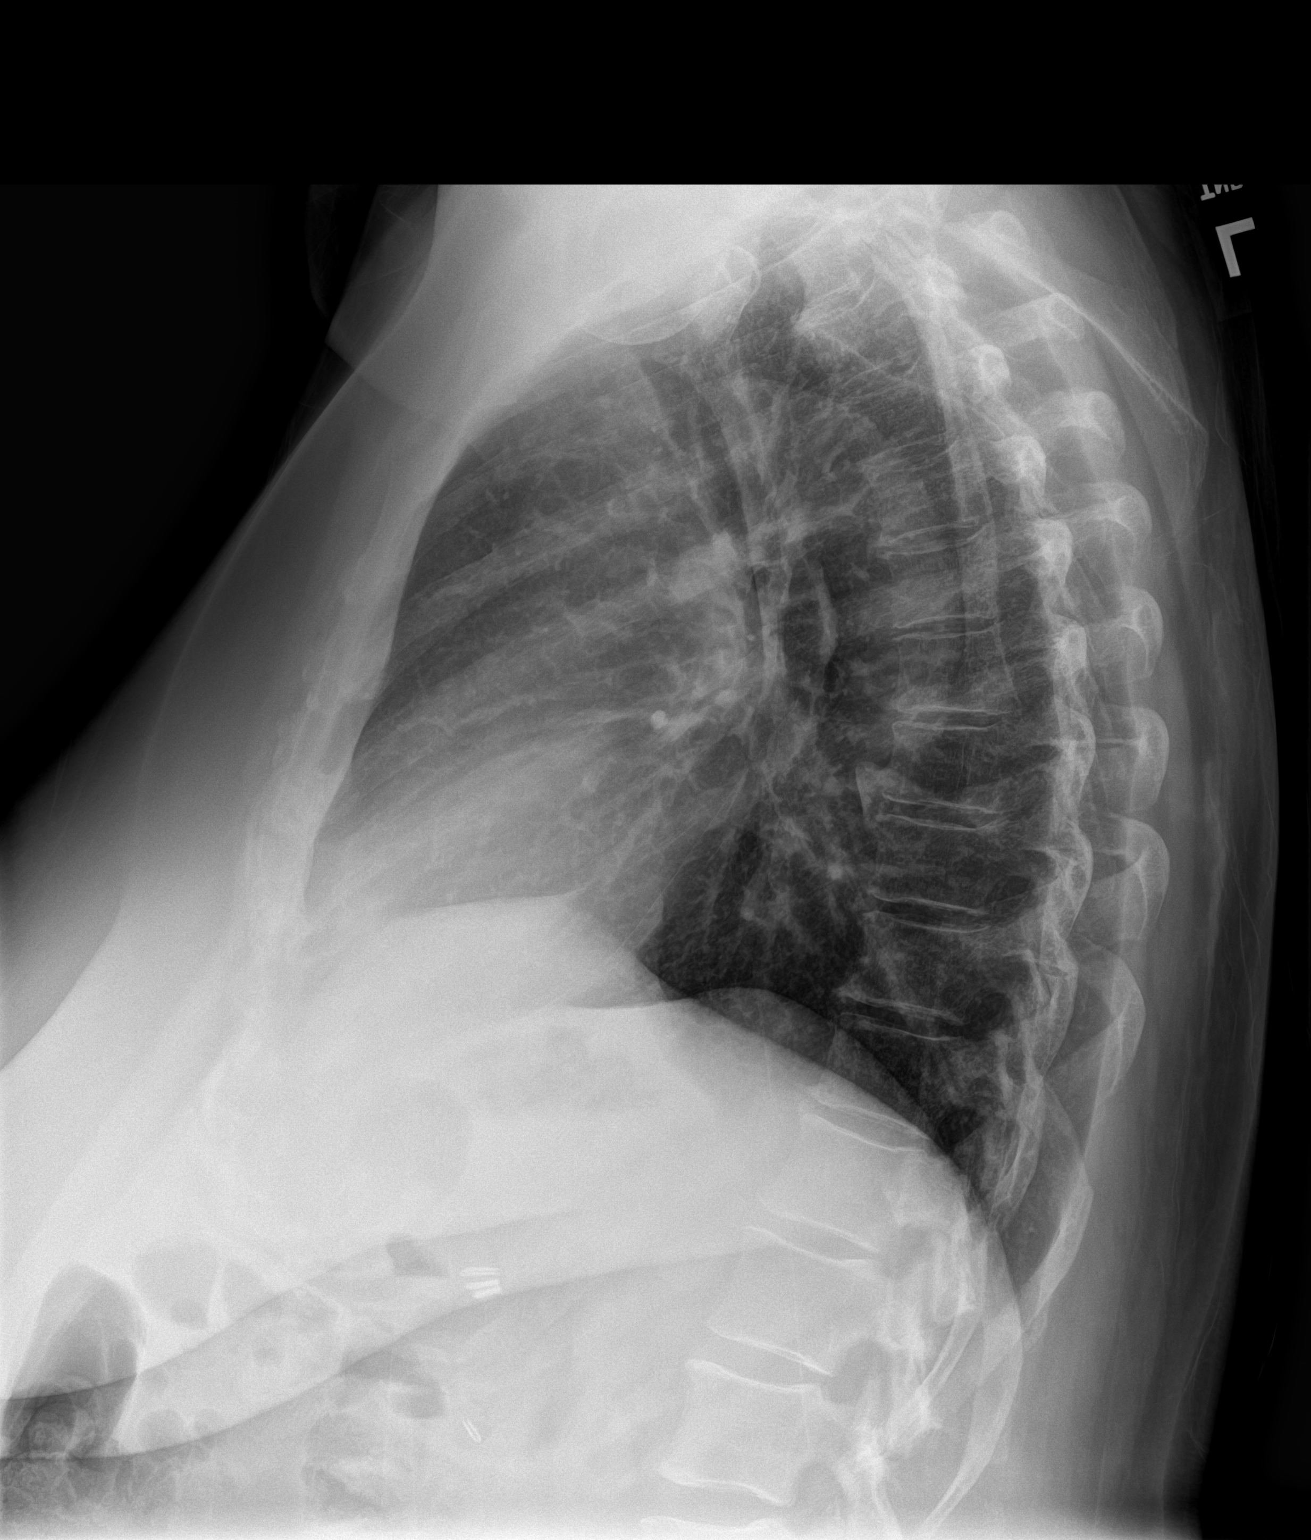

[2 of 2 positions shown; findings below may reference images not displayed]

FINDINGS: Normal mediastinum and cardiac silhouette. Ectatic aorta normal
pulmonary vasculature. No evidence of effusion, infiltrate, or
pneumothorax. No acute bony abnormality. Prior cholecystectomy.
IMPRESSION: No acute cardiopulmonary process.  Ectatic aorta.

## 2020-11-03 IMAGING — US US ABDOMEN LIMITED
1 series · 14 of 25 positions shown · non-contrast
Comparison: None.

CLINICAL DATA: Elevated liver enzymes.

EXAM:
ULTRASOUND ABDOMEN LIMITED RIGHT UPPER QUADRANT

[Series 1: us abdomen limited · 0.21mm/px · 14 of 42 slices shown]
[im 1/42]
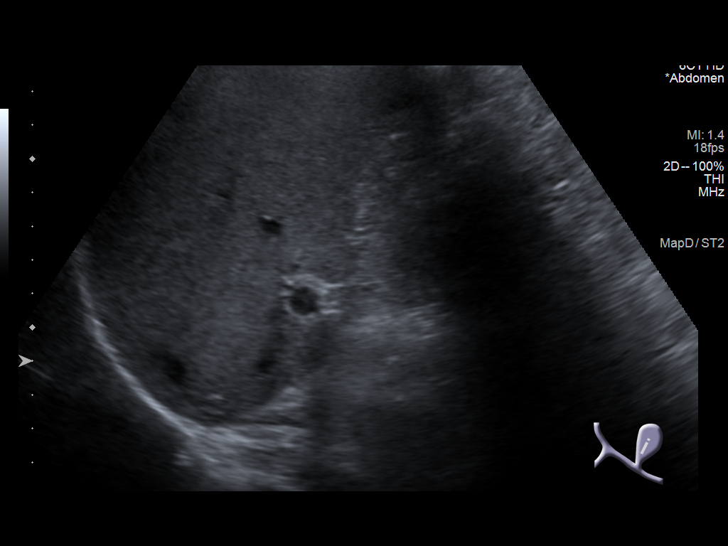
[im 4/42]
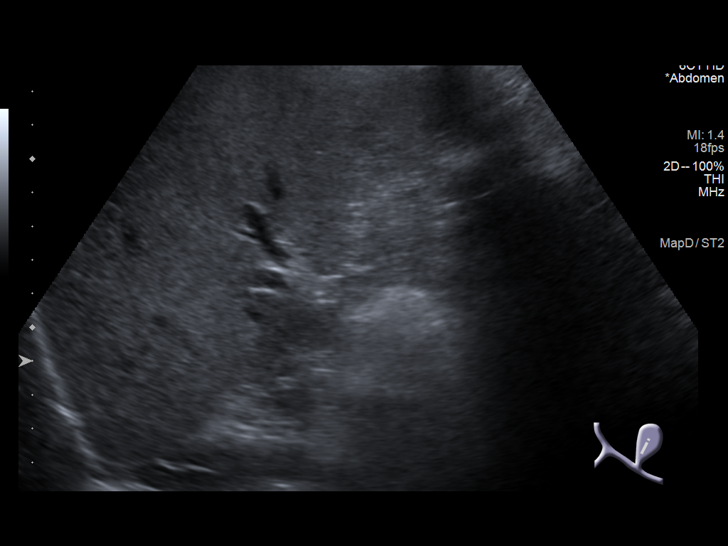
[im 7/42]
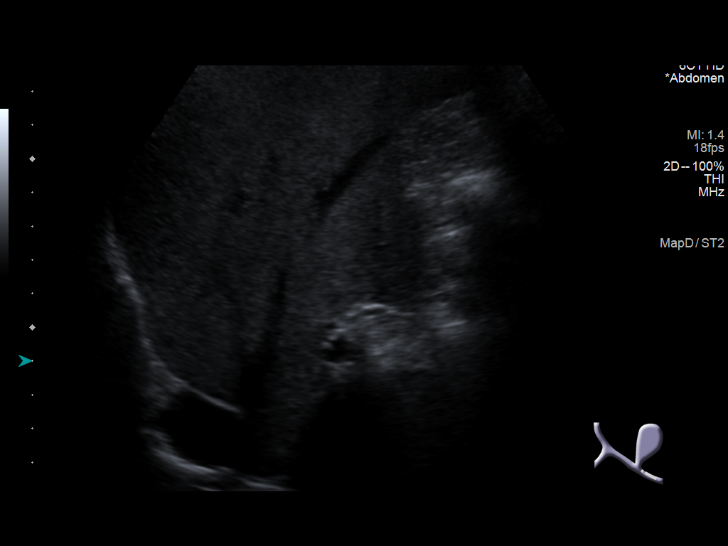
[im 11/42]
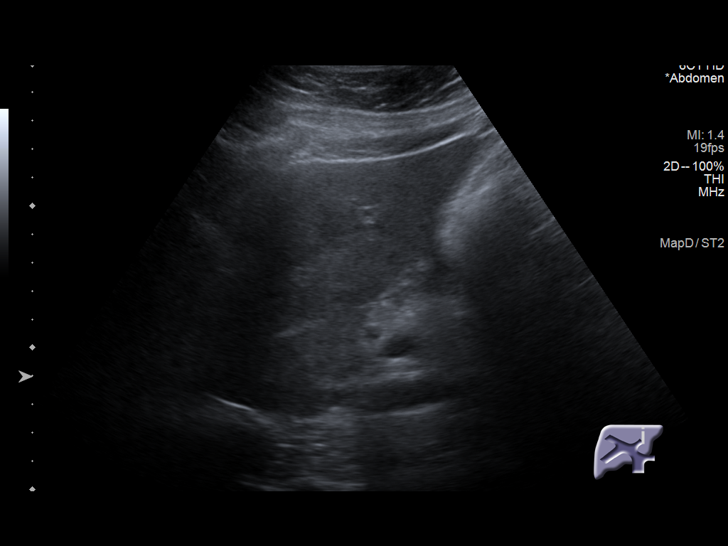
[im 14/42]
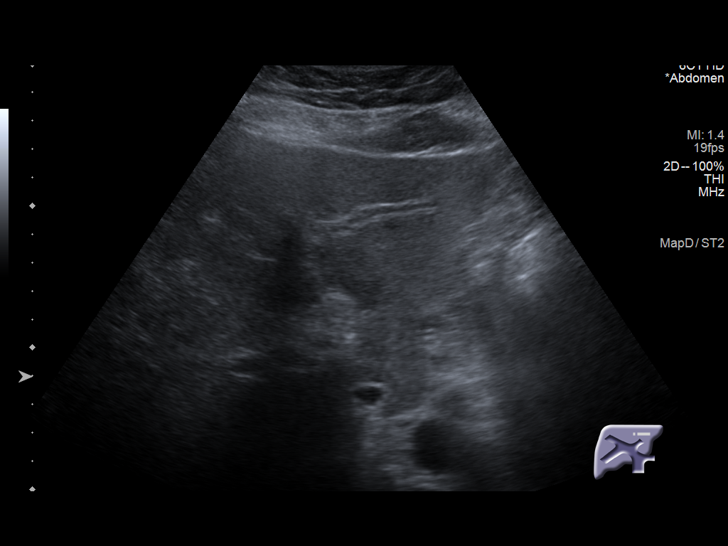
[im 16/42]
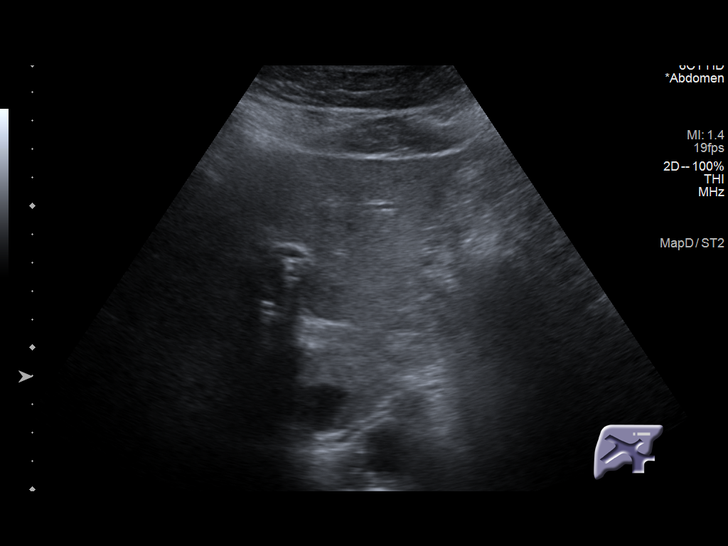
[im 19/42]
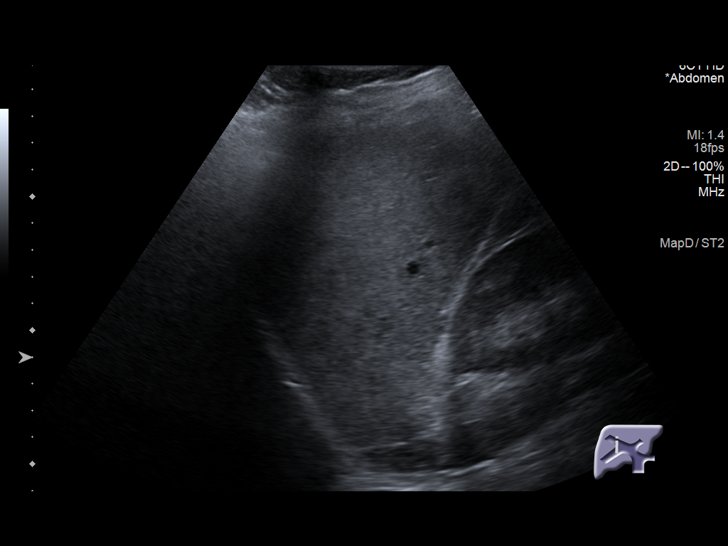
[im 23/42]
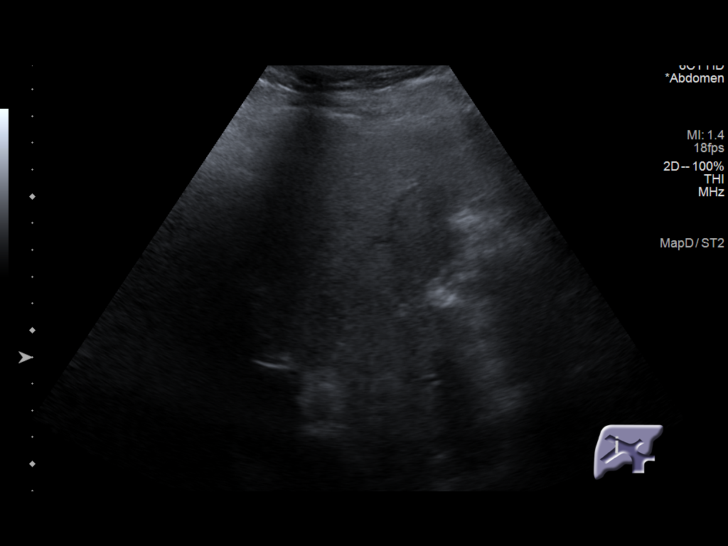
[im 26/42]
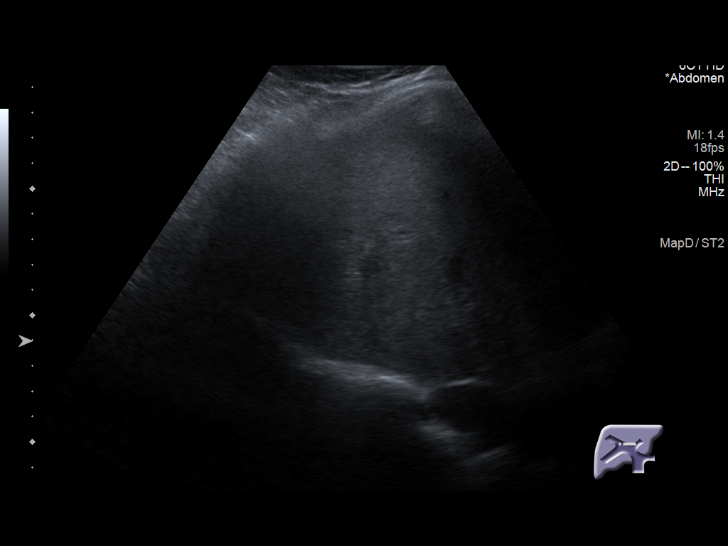
[im 28/42]
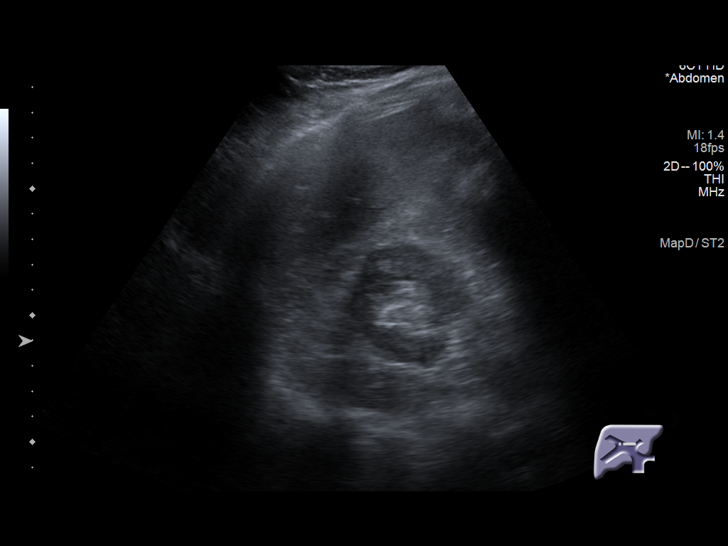
[im 31/42]
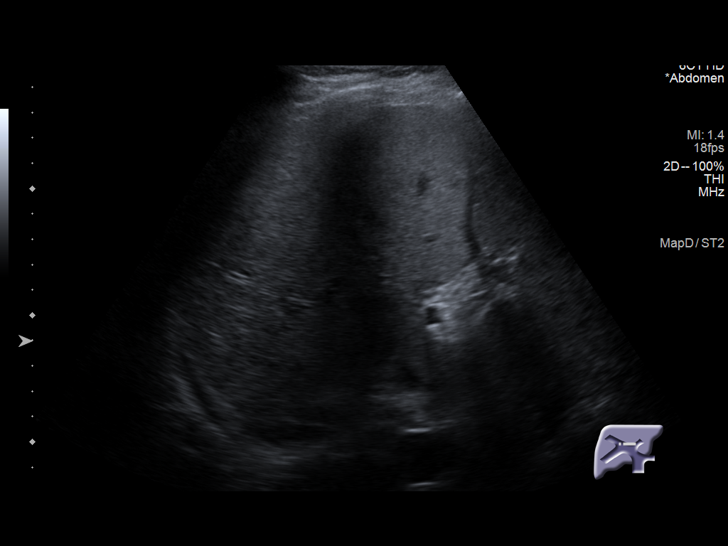
[im 35/42]
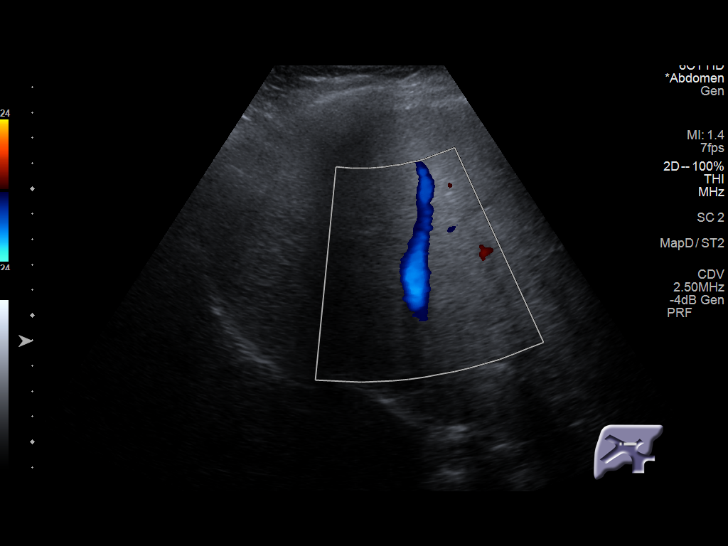
[im 38/42]
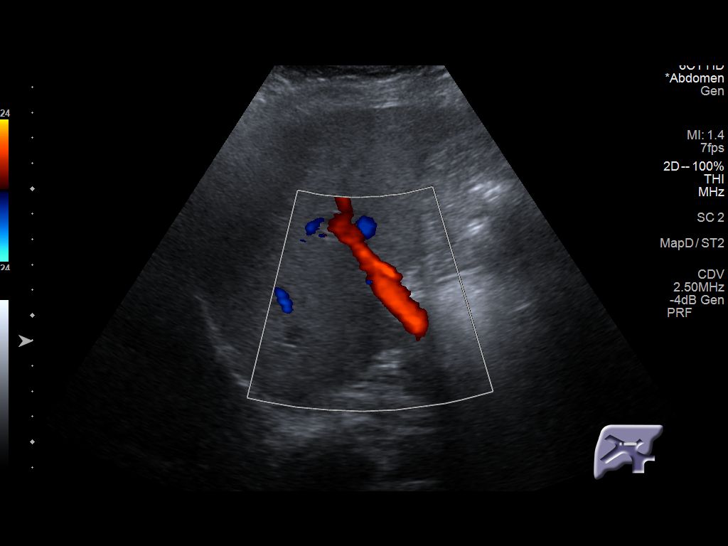
[im 42/42]
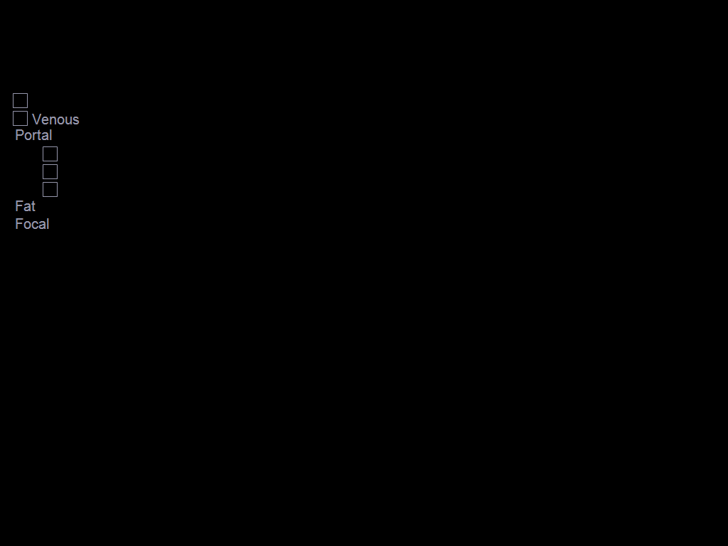

[14 of 25 positions shown; findings below may reference images not displayed]

FINDINGS: Gallbladder:

Surgically absent.

Common bile duct:

Diameter: 3 mm

Liver:

Diffusely increased echogenicity without focal abnormality evident.
No intrahepatic biliary duct dilatation. Portal vein is patent on
color Doppler imaging with normal direction of blood flow towards
the liver.

Other: None.
IMPRESSION: 1. Diffusely increased echogenicity of the liver parenchyma,
suggesting steatosis.
2. No biliary dilatation.
